# Patient Record
Sex: Female | Born: 1980 | Race: White | Hispanic: No | Marital: Married | Smoking: Never smoker
Health system: Southern US, Community
[De-identification: ages and names within clinical notes are randomized; demographics above are authoritative.]

## PROBLEM LIST (undated history)

## (undated) DIAGNOSIS — E039 Hypothyroidism, unspecified: Secondary | ICD-10-CM

## (undated) DIAGNOSIS — T7840XA Allergy, unspecified, initial encounter: Secondary | ICD-10-CM

## (undated) HISTORY — DX: Hypothyroidism, unspecified: E03.9

## (undated) HISTORY — PX: MOUTH SURGERY: SHX715

## (undated) HISTORY — DX: Allergy, unspecified, initial encounter: T78.40XA

---

## 2021-03-02 ENCOUNTER — Ambulatory Visit (INDEPENDENT_AMBULATORY_CARE_PROVIDER_SITE_OTHER): Payer: 59 | Admitting: Certified Nurse Midwife

## 2021-03-02 ENCOUNTER — Encounter: Payer: Self-pay | Admitting: Certified Nurse Midwife

## 2021-03-02 ENCOUNTER — Other Ambulatory Visit: Payer: Self-pay

## 2021-03-02 ENCOUNTER — Other Ambulatory Visit (HOSPITAL_COMMUNITY)
Admission: RE | Admit: 2021-03-02 | Discharge: 2021-03-02 | Disposition: A | Discharge status: Home / Self Care | Payer: 59 | Source: Ambulatory Visit | Attending: Certified Nurse Midwife | Admitting: Certified Nurse Midwife

## 2021-03-02 VITALS — BP 127/80 | HR 87 | Resp 16 | Ht 65.0 in | Wt 119.4 lb

## 2021-03-02 DIAGNOSIS — Z124 Encounter for screening for malignant neoplasm of cervix: Secondary | ICD-10-CM

## 2021-03-02 DIAGNOSIS — Z3041 Encounter for surveillance of contraceptive pills: Secondary | ICD-10-CM

## 2021-03-02 DIAGNOSIS — Z01419 Encounter for gynecological examination (general) (routine) without abnormal findings: Secondary | ICD-10-CM | POA: Diagnosis not present

## 2021-03-02 DIAGNOSIS — Z1231 Encounter for screening mammogram for malignant neoplasm of breast: Secondary | ICD-10-CM | POA: Diagnosis not present

## 2021-03-02 MED ORDER — SLYND 4 MG PO TABS: 1.0000 | ORAL_TABLET | Freq: Every day | ORAL | 4 refills | Status: DC

## 2021-03-02 NOTE — Patient Instructions (Addendum)
Drospirenone tablets (contraception) What is this medicine? DROSPIRENONE (dro SPY re nown) is an oral contraceptive (birth control pill). The product contains a female hormone known as a progestin. It is used to prevent pregnancy. This medicine may be used for other purposes; ask your health care provider or pharmacist if you have questions. COMMON BRAND NAME(S): Slynd What should I tell my health care provider before I take this medicine? They need to know if you have any of these conditions:  abnormal vaginal bleeding  adrenal gland disease  blood vessel disease or blood clots  breast, cervical, endometrial, ovarian, liver, or uterine cancer  diabetes  heart disease or recent heart attack  high potassium level  kidney disease  liver disease  mental depression  migraine headaches  stroke  an unusual or allergic reaction to drospirenone, progestins, or other medicines, foods, dyes, or preservatives  pregnant or trying to get pregnant  breast-feeding How should I use this medicine? Take this medicine by mouth. To reduce nausea, this medicine may be taken with food. Follow the directions on the prescription label. Take this medicine at the same time each day and in the order directed on the package. Do not take your medicine more often than directed. A patient package insert for the product will be given with each prescription and refill. Read this sheet carefully each time. The sheet may change frequently. Talk to your pediatrician regarding the use of this medicine in children. Special care may be needed. This medicine has been used in female children who have started having menstrual periods. Overdosage: If you think you have taken too much of this medicine contact a poison control center or emergency room at once. NOTE: This medicine is only for you. Do not share this medicine with others. What if I miss a dose? If you miss a dose, take it as soon as you can and refer to  the patient information sheet you received with your medicine for direction. If you miss more than one pill, this medicine may not be as effective and you may need to use another form of birth control. What may interact with this medicine? Do not take this medicine with any of the following medications:  atazanavir; cobicistat  bosentan  fosamprenavir This medicine may also interact with the following medications:  aprepitant  barbiturates like phenobarbital, primidone  carbamazepine  certain antibiotics like clarithromycin, rifampin, rifabutin, rifapentine  certain antivirals for HIV or hepatitis  certain diuretics like amiloride, spironolactone, triamterene  certain medicines for fungal infections like griseofulvin, ketoconazole, itraconazole, voriconazole  certain medicines for blood pressure, heart disease  cyclosporine  felbamate  heparin  medicines for diabetes  modafinil  NSAIDs, medicines for pain and inflammation, like ibuprofen or naproxen  oxcarbazepine  phenytoin  potassium supplements  rufinamide  St. John's wort  topiramate This list may not describe all possible interactions. Give your health care provider a list of all the medicines, herbs, non-prescription drugs, or dietary supplements you use. Also tell them if you smoke, drink alcohol, or use illegal drugs. Some items may interact with your medicine. What should I watch for while using this medicine? Visit your doctor or health care professional for regular checks on your progress. You will need a regular breast and pelvic exam and Pap smear while on this medicine. You may need blood work done while you are taking this medicine. If you have any reason to think you are pregnant, stop taking this medicine right away and contact your doctor  or health care professional. This medicine does not protect you against HIV infection (AIDS) or any other sexually transmitted diseases. If you are going to  have elective surgery, you may need to stop taking this medicine before the surgery. Consult your health care professional for advice. What side effects may I notice from receiving this medicine? Side effects that you should report to your doctor or health care professional as soon as possible:  allergic reactions like skin rash, itching or hives, swelling of the face, lips, or tongue  breast tissue changes or discharge  depressed mood  severe pain, swelling, or tenderness in the abdomen  signs and symptoms of a blood clot such as chest pain; shortness of breath; pain, swelling, or warmth in the leg  signs and symptoms of increased potassium like muscle weakness; chest pain; or fast, irregular heartbeat  signs and symptoms of liver injury like dark yellow or brown urine; general ill feeling or flu-like symptoms; light-colored stools; loss of appetite; nausea; right upper belly pain; unusually weak or tired; yellowing of the eyes or skin  signs and symptoms of a stroke like changes in vision; confusion; trouble speaking or understanding; severe headaches; sudden numbness or weakness of the face, arm or leg; trouble walking; dizziness; loss of balance or coordination  unusual vaginal bleeding  unusually weak or tired Side effects that usually do not require medical attention (report these to your doctor or health care professional if they continue or are bothersome):  acne  breast tenderness  headache  menstrual cramps  nausea  weight gain This list may not describe all possible side effects. Call your doctor for medical advice about side effects. You may report side effects to FDA at 1-800-FDA-1088. Where should I keep my medicine? Keep out of the reach of children. Store at room temperature between 20 and 25 degrees C (68 and 77 degrees F). Throw away any unused medicine after the expiration date. NOTE: This sheet is a summary. It may not cover all possible information. If you  have questions about this medicine, talk to your doctor, pharmacist, or health care provider.  2021 Elsevier/Gold Standard (2020-02-22 12:29:25)   Preventive Care 32-13 Years Old, Female Preventive care refers to lifestyle choices and visits with your health care provider that can promote health and wellness. This includes:  A yearly physical exam. This is also called an annual wellness visit.  Regular dental and eye exams.  Immunizations.  Screening for certain conditions.  Healthy lifestyle choices, such as: ? Eating a healthy diet. ? Getting regular exercise. ? Not using drugs or products that contain nicotine and tobacco. ? Limiting alcohol use. What can I expect for my preventive care visit? Physical exam Your health care provider will check your:  Height and weight. These may be used to calculate your BMI (body mass index). BMI is a measurement that tells if you are at a healthy weight.  Heart rate and blood pressure.  Body temperature.  Skin for abnormal spots. Counseling Your health care provider may ask you questions about your:  Past medical problems.  Family's medical history.  Alcohol, tobacco, and drug use.  Emotional well-being.  Home life and relationship well-being.  Sexual activity.  Diet, exercise, and sleep habits.  Work and work Statistician.  Access to firearms.  Method of birth control.  Menstrual cycle.  Pregnancy history. What immunizations do I need? Vaccines are usually given at various ages, according to a schedule. Your health care provider will recommend vaccines  for you based on your age, medical history, and lifestyle or other factors, such as travel or where you work.   What tests do I need? Blood tests  Lipid and cholesterol levels. These may be checked every 5 years, or more often if you are over 51 years old.  Hepatitis C test.  Hepatitis B test. Screening  Lung cancer screening. You may have this screening every  year starting at age 56 if you have a 30-pack-year history of smoking and currently smoke or have quit within the past 15 years.  Colorectal cancer screening. ? All adults should have this screening starting at age 23 and continuing until age 18. ? Your health care provider may recommend screening at age 46 if you are at increased risk. ? You will have tests every 1-10 years, depending on your results and the type of screening test.  Diabetes screening. ? This is done by checking your blood sugar (glucose) after you have not eaten for a while (fasting). ? You may have this done every 1-3 years.  Mammogram. ? This may be done every 1-2 years. ? Talk with your health care provider about when you should start having regular mammograms. This may depend on whether you have a family history of breast cancer.  BRCA-related cancer screening. This may be done if you have a family history of breast, ovarian, tubal, or peritoneal cancers.  Pelvic exam and Pap test. ? This may be done every 3 years starting at age 63. ? Starting at age 90, this may be done every 5 years if you have a Pap test in combination with an HPV test. Other tests  STD (sexually transmitted disease) testing, if you are at risk.  Bone density scan. This is done to screen for osteoporosis. You may have this scan if you are at high risk for osteoporosis. Talk with your health care provider about your test results, treatment options, and if necessary, the need for more tests. Follow these instructions at home: Eating and drinking  Eat a diet that includes fresh fruits and vegetables, whole grains, lean protein, and low-fat dairy products.  Take vitamin and mineral supplements as recommended by your health care provider.  Do not drink alcohol if: ? Your health care provider tells you not to drink. ? You are pregnant, may be pregnant, or are planning to become pregnant.  If you drink alcohol: ? Limit how much you have to  0-1 drink a day. ? Be aware of how much alcohol is in your drink. In the [place] one drink equals one 12 oz bottle of beer (355 mL), one 5 oz glass of wine (148 mL), or one 1 oz glass of hard liquor (44 mL).   Lifestyle  Take daily care of your teeth and gums. Brush your teeth every morning and night with fluoride toothpaste. Floss one time each day.  Stay active. Exercise for at least 30 minutes 5 or more days each week.  Do not use any products that contain nicotine or tobacco, such as cigarettes, e-cigarettes, and chewing tobacco. If you need help quitting, ask your health care provider.  Do not use drugs.  If you are sexually active, practice safe sex. Use a condom or other form of protection to prevent STIs (sexually transmitted infections).  If you do not wish to become pregnant, use a form of birth control. If you plan to become pregnant, see your health care provider for a prepregnancy visit.  If told by your  health care provider, take low-dose aspirin daily starting at age 25.  Find healthy ways to cope with stress, such as: ? Meditation, yoga, or listening to music. ? Journaling. ? Talking to a trusted person. ? Spending time with friends and family. Safety  Always wear your seat belt while driving or riding in a vehicle.  Do not drive: ? If you have been drinking alcohol. Do not ride with someone who has been drinking. ? When you are tired or distracted. ? While texting.  Wear a helmet and other protective equipment during sports activities.  If you have firearms in your house, make sure you follow all gun safety procedures. What's next?  Visit your health care provider once a year for an annual wellness visit.  Ask your health care provider how often you should have your eyes and teeth checked.  Stay up to date on all vaccines. This information is not intended to replace advice given to you by your health care provider. Make sure you discuss any questions you have  with your health care provider. Document Revised: 07/11/2020 Document Reviewed: 06/18/2018 Elsevier Patient Education  2021 [employer].

## 2021-03-02 NOTE — Progress Notes (Signed)
ANNUAL PREVENTATIVE CARE GYN  ENCOUNTER NOTE  Subjective:       Elaine Cross is a 40 y.o. [phone] female here for a routine annual gynecologic exam.  Current complaints: 1. Needs Pap smear and screening mammogram 2. Requests Slynd refill  Denies difficulty breathing or respiratory distress, chest pain, abdominal pain, excessive vaginal bleeding, dysuria, and leg pain or swelling.    Gynecologic History  No LMP recorded (exact date). (Menstrual status: Oral contraceptives).  Contraception: oral progesterone-only contraceptive, Slynd  Last Pap: 2019. Results were: normal  Last mammogram: due  Obstetric History OB History  Gravida Para Term Preterm AB Living  3       1 2   SAB IAB Ectopic Multiple Live Births  1            # Outcome Date GA Lbr Len/2nd Weight Sex Delivery Anes PTL Lv  3 Gravida           2 Gravida           1 SAB             History reviewed. No pertinent past medical history.  History reviewed. No pertinent surgical history.  Current Outpatient Medications on File Prior to Visit  Medication Sig Dispense Refill  . fexofenadine (ALLEGRA) 180 MG tablet Take 180 mg by mouth daily.    . Prenatal Vit-Fe Fumarate-FA (PRENATAL MULTIVITAMIN) TABS tablet Take 1 tablet by mouth daily at 12 noon.     No current facility-administered medications on file prior to visit.    Allergies  Allergen Reactions  . Ibuprofen Hives  . Prednisone Other (See Comments)    Severe low blood pressure, night sweats    Social History   Socioeconomic History  . Marital status: Married    Spouse name: Not on file  . Number of children: Not on file  . Years of education: Not on file  . Highest education level: Not on file  Occupational History  . Not on file  Tobacco Use  . Smoking status: Never Smoker  . Smokeless tobacco: Never Used  Vaping Use  . Vaping Use: Never used  Substance and Sexual Activity  . Alcohol use: Never  . Drug use: Never  . Sexual activity: Yes     Partners: Male    Birth control/protection: Pill  Other Topics Concern  . Not on file  Social History Narrative  . Not on file   Social Determinants of Health   Financial Resource Strain: Not on file  Food Insecurity: Not on file  Transportation Needs: Not on file  Physical Activity: Not on file  Stress: Not on file  Social Connections: Not on file  Intimate Partner Violence: Not on file    Family History  Problem Relation Age of Onset  . Hypertension Father   . Dementia Maternal Grandmother   . Heart disease Maternal Grandmother   . Heart attack Maternal Grandfather   . Gastric cancer Paternal Grandmother     The following portions of the patient's history were reviewed and updated as appropriate: allergies, current medications, past family history, past medical history, past social history, past surgical history and problem list.  Review of Systems  ROS negative except as noted above. Information obtained from patient.    Objective:   BP 127/80   Pulse 87   Resp 16   Ht 5\' 5"  (1.651 m)   Wt 119 lb 6.4 oz (54.2 kg)   LMP  (Exact Date)   BMI  19.87 kg/m    CONSTITUTIONAL: Well-developed, well-nourished female in no acute distress.   PSYCHIATRIC: Normal mood and affect. Normal behavior. Normal judgment and thought content.  NEUROLGIC: Alert and oriented to person, place, and time. Normal muscle tone coordination. No cranial nerve deficit noted.  HENT:  Normocephalic, atraumatic.  EYES: Conjunctivae and EOM are normal.   NECK: Normal range of motion, supple, no masses.  Normal thyroid.   SKIN: Skin is warm and dry. No rash noted. Not diaphoretic. No erythema. No pallor.  CARDIOVASCULAR: Normal heart rate noted, regular rhythm, no murmur.  RESPIRATORY: Clear to auscultation bilaterally. Effort and breath sounds normal, no problems with respiration noted.  BREASTS: Symmetric in size. No masses, skin changes, nipple drainage, or lymphadenopathy.  ABDOMEN:  Soft, normal bowel sounds, no distention noted.  No tenderness, rebound or guarding.   PELVIC:  External Genitalia: Normal  Vagina: Normal  Cervix: Normal, Pap collected  Uterus: Normal  Adnexa: Normal  MUSCULOSKELETAL: Normal range of motion. No tenderness.  No cyanosis, clubbing, or edema.  2+ distal pulses.  LYMPHATIC: No Axillary, Supraclavicular, or Inguinal Adenopathy.  Depression screen PHQ 2/9 03/02/2021  Down, Depressed, Hopeless 0  PHQ - 2 Score 0    Assessment:   Annual gynecologic examination 40 y.o.   Contraception: oral progesterone-only contraceptive, Slynd   Normal BMI   Problem List Items Addressed This Visit   None   Visit Diagnoses    Well woman exam    -  Primary   Relevant Orders   MM 3D SCREEN BREAST BILATERAL   Cytology - PAP   Surveillance for birth control, oral contraceptives       Screening for cervical cancer       Relevant Orders   Cytology - PAP   Screening mammogram for breast cancer       Relevant Orders   MM 3D SCREEN BREAST BILATERAL      Plan:   Pap: Pap Co Test   Mammogram: Ordered  Labs: Plans to establish with PCP, list given  Routine preventative health maintenance measures emphasized: Exercise/Diet/Weight control, Tobacco Warnings, Alcohol/Substance use risks and Stress Management; see AVS  Reviewed red flag symptoms and when to call  Return to Clinic - 1 Year for [facility] or sooner if needed   Serafina Royals, CNM  [facility], [facility] 03/02/21 2:47 PM

## 2021-03-07 LAB — CYTOLOGY - PAP
Comment: NEGATIVE
Diagnosis: NEGATIVE
High risk HPV: NEGATIVE

## 2021-05-30 ENCOUNTER — Telehealth: Payer: Self-pay | Admitting: Certified Nurse Midwife

## 2021-05-30 NOTE — Telephone Encounter (Signed)
Patient was last seen by Serafina Royals.  Marcelino Duster had ordered her mammogram.  When pt called to schedule they told her that a new order would need to be sent because Marcelino Duster is no longer with our practice.  Please let patient know when the order has been sent so that she can call and schedule her appt

## 2021-05-31 ENCOUNTER — Other Ambulatory Visit: Payer: Self-pay

## 2021-05-31 DIAGNOSIS — Z1231 Encounter for screening mammogram for malignant neoplasm of breast: Secondary | ICD-10-CM

## 2021-05-31 NOTE — Progress Notes (Signed)
Patient had an order placed for mamm last May, went to appointment and was unable to have mammogram done because order had been canceled on 05/30/2021 due to [facility] no longer being with our office. Patient needs mamm ordered to be able to get mamm as soon as possible.

## 2021-06-18 ENCOUNTER — Other Ambulatory Visit: Payer: Self-pay

## 2021-06-18 ENCOUNTER — Ambulatory Visit
Admission: RE | Admit: 2021-06-18 | Discharge: 2021-06-18 | Disposition: A | Discharge status: Home / Self Care | Payer: Managed Care, Other (non HMO) | Source: Ambulatory Visit | Attending: Certified Nurse Midwife | Admitting: Certified Nurse Midwife

## 2021-06-18 DIAGNOSIS — Z1231 Encounter for screening mammogram for malignant neoplasm of breast: Secondary | ICD-10-CM | POA: Insufficient documentation

## 2021-07-18 ENCOUNTER — Ambulatory Visit (INDEPENDENT_AMBULATORY_CARE_PROVIDER_SITE_OTHER): Payer: Managed Care, Other (non HMO) | Admitting: Nurse Practitioner

## 2021-07-18 ENCOUNTER — Other Ambulatory Visit: Payer: Self-pay

## 2021-07-18 ENCOUNTER — Encounter: Payer: Self-pay | Admitting: Nurse Practitioner

## 2021-07-18 VITALS — BP 124/85 | HR 82 | Temp 99.2°F | Ht 65.0 in | Wt 122.6 lb

## 2021-07-18 DIAGNOSIS — Z7689 Persons encountering health services in other specified circumstances: Secondary | ICD-10-CM

## 2021-07-18 DIAGNOSIS — Z789 Other specified health status: Secondary | ICD-10-CM | POA: Diagnosis not present

## 2021-07-18 NOTE — Assessment & Plan Note (Signed)
Continue collaboration with GYN -- has annual pap testing with them.

## 2021-07-18 NOTE — Patient Instructions (Signed)

## 2021-07-18 NOTE — Progress Notes (Signed)
New Patient Office Visit  Subjective:  Patient ID: Elaine Cross, female    DOB: 1981/02/12  Age: 40 y.o. MRN: [id number]  CC:  Chief Complaint  Patient presents with   Establish Care    Patient is here to establish care. Patient denies having any concerns at today's visit. Patient states she is new to the area and wanted to establish care and have labs and routine work up.     HPI Elaine Cross presents for new patient visit to establish care.  Introduced to Publishing rights manager role and practice setting.  All questions answered.  Discussed provider/patient relationship and expectations.  Moved here one year ago from [place] of [place].  Was often followed by gynecology -- [facility].  Followed by [facility] locally.  Is on BCP - Slynd.    No current chronic health issues -- watches her BP due to her father having elevated BP.  Does endorse white coat syndrome.  History reviewed. No pertinent past medical history.  Past Surgical History:  Procedure Laterality Date   MOUTH SURGERY      Family History  Problem Relation Age of Onset   Thyroid disease Mother    Hypertension Father    Depression Brother    Healthy Brother    Breast cancer Paternal Aunt 30   Dementia Maternal Grandmother    Heart disease Maternal Grandmother    Heart attack Maternal Grandfather    Gastric cancer Paternal Grandmother     Social History   Socioeconomic History   Marital status: Married    Spouse name: Not on file   Number of children: Not on file   Years of education: Not on file   Highest education level: Not on file  Occupational History   Not on file  Tobacco Use   Smoking status: Never   Smokeless tobacco: Never  Vaping Use   Vaping Use: Never used  Substance and Sexual Activity   Alcohol use: Never   Drug use: Never   Sexual activity: Yes    Partners: Male    Birth control/protection: Pill  Other Topics Concern   Not on file  Social History Narrative   Not on  file   Social Determinants of Health   Financial Resource Strain: Low Risk    Difficulty of Paying Living Expenses: Not hard at all  Food Insecurity: No Food Insecurity   Worried About Programme researcher, broadcasting/film/video in the Last Year: Never true   Ran Out of Food in the Last Year: Never true  Transportation Needs: No Transportation Needs   Lack of Transportation (Medical): No   Lack of Transportation (Non-Medical): No  Physical Activity: Insufficiently Active   Days of Exercise per Week: 4 days   Minutes of Exercise per Session: 30 min  Stress: No Stress Concern Present   Feeling of Stress : Only a little  Social Connections: Moderately Integrated   Frequency of Communication with Friends and Family: More than three times a week   Frequency of Social Gatherings with Friends and Family: More than three times a week   Attends Religious Services: More than 4 times per year   Active Member of [employer] or Organizations: No   Attends Banker Meetings: Never   Marital Status: Married  Catering manager Violence: Not At Risk   Fear of Current or Ex-Partner: No   Emotionally Abused: No   Physically Abused: No   Sexually Abused: No    ROS Review of  Systems  Constitutional:  Negative for activity change, appetite change, diaphoresis, fatigue and fever.  Respiratory:  Negative for cough, chest tightness and shortness of breath.   Cardiovascular:  Negative for chest pain, palpitations and leg swelling.  Gastrointestinal: Negative.   Neurological: Negative.   Psychiatric/Behavioral: Negative.     Objective:   Today's Vitals: BP 124/85   Pulse 82   Temp 99.2 F (37.3 C) (Oral)   Ht 5\' 5"  (1.651 m)   Wt 122 lb 9.6 oz (55.6 kg)   SpO2 100%   BMI 20.40 kg/m   Physical Exam Vitals and nursing note reviewed.  Constitutional:      General: She is awake. She is not in acute distress.    Appearance: She is well-developed and well-groomed. She is not ill-appearing or toxic-appearing.   HENT:     Head: Normocephalic.     Right Ear: Hearing normal.     Left Ear: Hearing normal.  Eyes:     General: Lids are normal.        Right eye: No discharge.        Left eye: No discharge.     Conjunctiva/sclera: Conjunctivae normal.     Pupils: Pupils are equal, round, and reactive to light.  Neck:     Thyroid: No thyromegaly.     Vascular: No carotid bruit.  Cardiovascular:     Rate and Rhythm: Normal rate and regular rhythm.     Heart sounds: Normal heart sounds. No murmur heard.   No gallop.  Pulmonary:     Effort: Pulmonary effort is normal. No accessory muscle usage or respiratory distress.     Breath sounds: Normal breath sounds.  Abdominal:     General: Bowel sounds are normal.     Palpations: Abdomen is soft.  Musculoskeletal:     Cervical back: Normal range of motion and neck supple.     Right lower leg: No edema.     Left lower leg: No edema.  Lymphadenopathy:     Cervical: No cervical adenopathy.  Skin:    General: Skin is warm and dry.  Neurological:     Mental Status: She is alert and oriented to person, place, and time.  Psychiatric:        Attention and Perception: Attention normal.        Mood and Affect: Mood normal.        Speech: Speech normal.        Behavior: Behavior normal. Behavior is cooperative.        Thought Content: Thought content normal.    Assessment & Plan:   Problem List Items Addressed This Visit       Other   Uses birth control    Continue collaboration with GYN -- has annual pap testing with them.        Other Visit Diagnoses     Encounter to establish care    -  Primary   Overall healthy with no acute issues -- will plan on physical in 4 weeks.       Outpatient Encounter Medications as of 07/18/2021  Medication Sig   Drospirenone (SLYND) 4 MG TABS Take 1 tablet by mouth daily.   fexofenadine (ALLEGRA) 180 MG tablet Take 180 mg by mouth daily.   Prenatal Vit-Fe Fumarate-FA (PRENATAL MULTIVITAMIN) TABS tablet  Take 1 tablet by mouth daily at 12 noon.   No facility-administered encounter medications on file as of 07/18/2021.    Follow-up: Return in about 4  weeks (around 08/15/2021) for Annual physical.   Marjie Skiff, NP

## 2021-08-10 ENCOUNTER — Encounter: Payer: Managed Care, Other (non HMO) | Admitting: Nurse Practitioner

## 2021-09-17 ENCOUNTER — Other Ambulatory Visit: Payer: Self-pay

## 2021-09-17 ENCOUNTER — Ambulatory Visit (INDEPENDENT_AMBULATORY_CARE_PROVIDER_SITE_OTHER): Payer: Managed Care, Other (non HMO) | Admitting: Nurse Practitioner

## 2021-09-17 ENCOUNTER — Encounter: Payer: Self-pay | Admitting: Nurse Practitioner

## 2021-09-17 VITALS — BP 96/68 | HR 87 | Wt 122.8 lb

## 2021-09-17 DIAGNOSIS — E559 Vitamin D deficiency, unspecified: Secondary | ICD-10-CM | POA: Diagnosis not present

## 2021-09-17 DIAGNOSIS — Z8342 Family history of familial hypercholesterolemia: Secondary | ICD-10-CM

## 2021-09-17 DIAGNOSIS — Z23 Encounter for immunization: Secondary | ICD-10-CM | POA: Diagnosis not present

## 2021-09-17 DIAGNOSIS — J309 Allergic rhinitis, unspecified: Secondary | ICD-10-CM | POA: Insufficient documentation

## 2021-09-17 DIAGNOSIS — Z1159 Encounter for screening for other viral diseases: Secondary | ICD-10-CM

## 2021-09-17 DIAGNOSIS — Z136 Encounter for screening for cardiovascular disorders: Secondary | ICD-10-CM

## 2021-09-17 DIAGNOSIS — Z0000 Encounter for general adult medical examination without abnormal findings: Secondary | ICD-10-CM | POA: Diagnosis not present

## 2021-09-17 DIAGNOSIS — Z13220 Encounter for screening for lipoid disorders: Secondary | ICD-10-CM | POA: Diagnosis not present

## 2021-09-17 DIAGNOSIS — Z114 Encounter for screening for human immunodeficiency virus [HIV]: Secondary | ICD-10-CM

## 2021-09-17 NOTE — Progress Notes (Signed)
BP 96/68   Pulse 87   Wt 122 lb 12.8 oz (55.7 kg)   SpO2 100%   BMI 20.43 kg/m    Subjective:    Patient ID: Elaine Cross, female    DOB: November 02, 1980, 40 y.o.   MRN: [id number]  HPI: Elaine Cross is a 40 y.o. female presenting on 09/17/2021 for comprehensive medical examination. Current medical complaints include:none  She currently lives with: husband and children Menopausal Symptoms: no  Depression Screen done today and results listed below:  Depression screen [facility] 2/9 09/17/2021 07/18/2021 03/02/2021  Decreased Interest 0 0 -  Down, Depressed, Hopeless 0 0 0  PHQ - 2 Score 0 0 0  Altered sleeping 0 - -  Tired, decreased energy 0 - -  Change in appetite 0 - -  Feeling bad or failure about yourself  0 - -  Trouble concentrating 0 - -  Moving slowly or fidgety/restless 0 - -  Suicidal thoughts 0 - -  PHQ-9 Score 0 - -  Difficult doing work/chores Not difficult at all - -    The patient does not have a history of falls. I did not complete a risk assessment for falls. A plan of care for falls was not documented.   Past Medical History:  History reviewed. No pertinent past medical history.  Surgical History:  Past Surgical History:  Procedure Laterality Date   MOUTH SURGERY      Medications:  Current Outpatient Medications on File Prior to Visit  Medication Sig   Drospirenone (SLYND) 4 MG TABS Take 1 tablet by mouth daily.   fexofenadine (ALLEGRA) 180 MG tablet Take 180 mg by mouth daily.   Prenatal Vit-Fe Fumarate-FA (PRENATAL MULTIVITAMIN) TABS tablet Take 1 tablet by mouth daily at 12 noon.   No current facility-administered medications on file prior to visit.    Allergies:  Allergies  Allergen Reactions   Ibuprofen Hives   Other    Prednisone Other (See Comments)    Severe low blood pressure, night sweats    Social History:  Social History   Socioeconomic History   Marital status: Married    Spouse name: Not on file   Number of  children: Not on file   Years of education: Not on file   Highest education level: Not on file  Occupational History   Not on file  Tobacco Use   Smoking status: Never   Smokeless tobacco: Never  Vaping Use   Vaping Use: Never used  Substance and Sexual Activity   Alcohol use: Never   Drug use: Never   Sexual activity: Yes    Partners: Male    Birth control/protection: Pill  Other Topics Concern   Not on file  Social History Narrative   Not on file   Social Determinants of Health   Financial Resource Strain: Low Risk    Difficulty of Paying Living Expenses: Not hard at all  Food Insecurity: No Food Insecurity   Worried About Programme researcher, broadcasting/film/video in the Last Year: Never true   Ran Out of Food in the Last Year: Never true  Transportation Needs: No Transportation Needs   Lack of Transportation (Medical): No   Lack of Transportation (Non-Medical): No  Physical Activity: Insufficiently Active   Days of Exercise per Week: 4 days   Minutes of Exercise per Session: 30 min  Stress: No Stress Concern Present   Feeling of Stress : Only a little  Social Connections: Moderately Integrated  Frequency of Communication with Friends and Family: More than three times a week   Frequency of Social Gatherings with Friends and Family: More than three times a week   Attends Religious Services: More than 4 times per year   Active Member of [employer] or Organizations: No   Attends Engineer, structural: Never   Marital Status: Married  Catering manager Violence: Not At Risk   Fear of Current or Ex-Partner: No   Emotionally Abused: No   Physically Abused: No   Sexually Abused: No   Social History   Tobacco Use  Smoking Status Never  Smokeless Tobacco Never   Social History   Substance and Sexual Activity  Alcohol Use Never    Family History:  Family History  Problem Relation Age of Onset   Thyroid disease Mother    Hypertension Father    Depression Brother    Healthy  Brother    Breast cancer Paternal Aunt 30   Dementia Maternal Grandmother    Heart disease Maternal Grandmother    Heart attack Maternal Grandfather    Gastric cancer Paternal Grandmother     Past medical history, surgical history, medications, allergies, family history and social history reviewed with patient today and changes made to appropriate areas of the chart.   ROS All other ROS negative except what is listed above and in the HPI.      Objective:    BP 96/68   Pulse 87   Wt 122 lb 12.8 oz (55.7 kg)   SpO2 100%   BMI 20.43 kg/m   Wt Readings from Last 3 Encounters:  09/17/21 122 lb 12.8 oz (55.7 kg)  07/18/21 122 lb 9.6 oz (55.6 kg)  03/02/21 119 lb 6.4 oz (54.2 kg)    Physical Exam Vitals and nursing note reviewed.  Constitutional:      General: She is awake. She is not in acute distress.    Appearance: She is well-developed and well-groomed. She is not ill-appearing or toxic-appearing.  HENT:     Head: Normocephalic and atraumatic.     Right Ear: Hearing, tympanic membrane, ear canal and external ear normal. No drainage.     Left Ear: Hearing, tympanic membrane, ear canal and external ear normal. No drainage.     Nose: Nose normal.     Right Sinus: No maxillary sinus tenderness or frontal sinus tenderness.     Left Sinus: No maxillary sinus tenderness or frontal sinus tenderness.     Mouth/Throat:     Mouth: Mucous membranes are moist.     Pharynx: Oropharynx is clear. Uvula midline. No pharyngeal swelling, oropharyngeal exudate or posterior oropharyngeal erythema.  Eyes:     General: Lids are normal.        Right eye: No discharge.        Left eye: No discharge.     Extraocular Movements: Extraocular movements intact.     Conjunctiva/sclera: Conjunctivae normal.     Pupils: Pupils are equal, round, and reactive to light.     Visual Fields: Right eye visual fields normal and left eye visual fields normal.  Neck:     Thyroid: No thyromegaly.     Vascular:  No carotid bruit.     Trachea: Trachea normal.  Cardiovascular:     Rate and Rhythm: Normal rate and regular rhythm.     Heart sounds: Normal heart sounds. No murmur heard.   No gallop.  Pulmonary:     Effort: Pulmonary effort is normal. No  accessory muscle usage or respiratory distress.     Breath sounds: Normal breath sounds.  Abdominal:     General: Bowel sounds are normal.     Palpations: Abdomen is soft. There is no hepatomegaly or splenomegaly.     Tenderness: There is no abdominal tenderness.  Musculoskeletal:        General: Normal range of motion.     Cervical back: Normal range of motion and neck supple.     Right lower leg: No edema.     Left lower leg: No edema.  Lymphadenopathy:     Head:     Right side of head: No submental, submandibular, tonsillar, preauricular or posterior auricular adenopathy.     Left side of head: No submental, submandibular, tonsillar, preauricular or posterior auricular adenopathy.     Cervical: No cervical adenopathy.  Skin:    General: Skin is warm and dry.     Capillary Refill: Capillary refill takes less than 2 seconds.     Findings: No rash.  Neurological:     Mental Status: She is alert and oriented to person, place, and time.     Gait: Gait is intact.     Deep Tendon Reflexes: Reflexes are normal and symmetric.     Reflex Scores:      Brachioradialis reflexes are 2+ on the right side and 2+ on the left side.      Patellar reflexes are 2+ on the right side and 2+ on the left side. Psychiatric:        Attention and Perception: Attention normal.        Mood and Affect: Mood normal.        Speech: Speech normal.        Behavior: Behavior normal. Behavior is cooperative.        Thought Content: Thought content normal.        Judgment: Judgment normal.    Results for orders placed or performed in visit on 03/02/21  Cytology - PAP  Result Value Ref Range   High risk HPV Negative    Adequacy      Satisfactory for evaluation;  transformation zone component PRESENT.   Diagnosis      - Negative for intraepithelial lesion or malignancy (NILM)   Comment Normal Reference Range HPV - Negative       Assessment & Plan:   Problem List Items Addressed This Visit       Other   Family history of high cholesterol    Check lipid panel today and yearly -- diet focus.      Relevant Orders   Lipid Panel w/o Chol/HDL Ratio   Other Visit Diagnoses     Vitamin D deficiency    -  Primary   History of low levels reported, check on labs today and initiate supplement as needed.   Relevant Orders   VITAMIN D 25 Hydroxy (Vit-D Deficiency, Fractures)   Encounter for lipid screening for cardiovascular disease       Check lipid panel today   Relevant Orders   Lipid Panel w/o Chol/HDL Ratio   Need for hepatitis C screening test       Hep C screening on labs today, discussed with patient.   Relevant Orders   Hepatitis C antibody   Encounter for screening for HIV       HIV screening on labs today, discussed with patient.   Relevant Orders   HIV Antibody (routine testing w rflx)   Need for  Tdap vaccination       Tdap in office today, discussed with patient.   Relevant Orders   Tdap vaccine greater than or equal to 7yo IM   Flu vaccine need       Flu vaccine in office today.   Relevant Orders   Flu Vaccine QUAD 6+ mos PF IM (Fluarix Quad PF)   Encounter for annual physical exam       Annual physical today in office, will obtain annual labs.   Relevant Orders   CBC with Differential/Platelet   Comprehensive metabolic panel   TSH        Follow up plan: Return in about 1 year (around 09/17/2022) for Annual physical.   LABORATORY TESTING:  - Pap smear: up to date  IMMUNIZATIONS:   - Tdap: Tetanus vaccination status reviewed: last tetanus booster within 10 years, Td vaccination indicated and given today. - Influenza: Administered today - Pneumovax: Not applicable - Prevnar: Not applicable - COVID: Refused -  HPV: Not applicable - Shingrix vaccine: Not applicable  SCREENING: -Mammogram: Up to date  - Colonoscopy: Not applicable  - Bone Density: Not applicable  -Hearing Test: Not applicable  -Spirometry: Not applicable   PATIENT COUNSELING:   Advised to take 1 mg of folate supplement per day if capable of pregnancy.   Sexuality: Discussed sexually transmitted diseases, partner selection, use of condoms, avoidance of unintended pregnancy  and contraceptive alternatives.   Advised to avoid cigarette smoking.  I discussed with the patient that most people either abstain from alcohol or drink within safe limits (<=14/week and <=4 drinks/occasion for males, <=7/weeks and <= 3 drinks/occasion for females) and that the risk for alcohol disorders and other health effects rises proportionally with the number of drinks per week and how often a drinker exceeds daily limits.  Discussed cessation/primary prevention of drug use and availability of treatment for abuse.   Diet: Encouraged to adjust caloric intake to maintain  or achieve ideal body weight, to reduce intake of dietary saturated fat and total fat, to limit sodium intake by avoiding high sodium foods and not adding table salt, and to maintain adequate dietary potassium and calcium preferably from fresh fruits, vegetables, and low-fat dairy products.    Stressed the importance of regular exercise  Injury prevention: Discussed safety belts, safety helmets, smoke detector, smoking near bedding or upholstery.   Dental health: Discussed importance of regular tooth brushing, flossing, and dental visits.    NEXT PREVENTATIVE PHYSICAL DUE IN 1 YEAR. Return in about 1 year (around 09/17/2022) for Annual physical.

## 2021-09-17 NOTE — Patient Instructions (Signed)
Influenza (Flu) Vaccine (Inactivated or Recombinant): What You Need to Know 1. Why get vaccinated? Influenza vaccine can prevent influenza (flu). Flu is a contagious disease that spreads around the [place] every year, usually between October and May. Anyone can get the flu, but it is more dangerous for some people. Infants and young children, people 65 years and older, pregnant people, and people with certain health conditions or a weakened immune system are at greatest risk of flu complications. Pneumonia, bronchitis, sinus infections, and ear infections are examples of flu-related complications. If you have a medical condition, such as heart disease, cancer, or diabetes, flu can make it worse. Flu can cause fever and chills, sore throat, muscle aches, fatigue, cough, headache, and runny or stuffy nose. Some people may have vomiting and diarrhea, though this is more common in children than adults. In an average year, thousands of people in the [place] die from flu, and many more are hospitalized. Flu vaccine prevents millions of illnesses and flu-related visits to the doctor each year. 2. Influenza vaccines CDC recommends everyone 6 months and older get vaccinated every flu season. Children 6 months through 8 years of age may need 2 doses during a single flu season. Everyone else needs only 1 dose each flu season. It takes about 2 weeks for protection to develop after vaccination. There are many flu viruses, and they are always changing. Each year a new flu vaccine is made to protect against the influenza viruses believed to be likely to cause disease in the upcoming flu season. Even when the vaccine doesn't exactly match these viruses, it may still provide some protection. Influenza vaccine does not cause flu. Influenza vaccine may be given at the same time as other vaccines. 3. Talk with your health care provider Tell your vaccination provider if the person getting the vaccine: Has had  an allergic reaction after a previous dose of influenza vaccine, or has any severe, life-threatening allergies Has ever had Guillain-Barr Syndrome (also called "GBS") In some cases, your health care provider may decide to postpone influenza vaccination until a future visit. Influenza vaccine can be administered at any time during pregnancy. People who are or will be pregnant during influenza season should receive inactivated influenza vaccine. People with minor illnesses, such as a cold, may be vaccinated. People who are moderately or severely ill should usually wait until they recover before getting influenza vaccine. Your health care provider can give you more information. 4. Risks of a vaccine reaction Soreness, redness, and swelling where the shot is given, fever, muscle aches, and headache can happen after influenza vaccination. There may be a very small increased risk of Guillain-Barr Syndrome (GBS) after inactivated influenza vaccine (the flu shot). Young children who get the flu shot along with pneumococcal vaccine (PCV13) and/or DTaP vaccine at the same time might be slightly more likely to have a seizure caused by fever. Tell your health care provider if a child who is getting flu vaccine has ever had a seizure. People sometimes faint after medical procedures, including vaccination. Tell your provider if you feel dizzy or have vision changes or ringing in the ears. As with any medicine, there is a very remote chance of a vaccine causing a severe allergic reaction, other serious injury, or death. 5. What if there is a serious problem? An allergic reaction could occur after the vaccinated person leaves the clinic. If you see signs of a severe allergic reaction (hives, swelling of the face and throat, difficulty breathing,   a fast heartbeat, dizziness, or weakness), call 9-1-1 and get the person to the nearest hospital. For other signs that concern you, call your health care provider. Adverse  reactions should be reported to the Vaccine Adverse Event Reporting System (VAERS). Your health care provider will usually file this report, or you can do it yourself. Visit the VAERS website at www.vaers.hhs.gov or call 1-800-822-7967. VAERS is only for reporting reactions, and VAERS staff members do not give medical advice. 6. The National Vaccine Injury Compensation Program The National Vaccine Injury Compensation Program (VICP) is a federal program that was created to compensate people who may have been injured by certain vaccines. Claims regarding alleged injury or death due to vaccination have a time limit for filing, which may be as short as two years. Visit the VICP website at www.hrsa.gov/vaccinecompensation or call 1-800-338-2382 to learn about the program and about filing a claim. 7. How can I learn more? Ask your health care provider. Call your local or state health department. Visit the website of the Food and Drug Administration (FDA) for vaccine package inserts and additional information at www.fda.gov/vaccines-blood-biologics/vaccines. Contact the Centers for Disease Control and Prevention (CDC): Call 1-800-232-4636 (1-800-CDC-INFO) or Visit CDC's website at www.cdc.gov/flu. Vaccine Information Statement Inactivated Influenza Vaccine (05/26/2020) This information is not intended to replace advice given to you by your health care provider. Make sure you discuss any questions you have with your health care provider. Document Revised: 06/28/2021 Document Reviewed: 06/28/2021 Elsevier Patient Education  2022 Elsevier Inc.  

## 2021-09-17 NOTE — Assessment & Plan Note (Signed)
Check lipid panel today and yearly -- diet focus.  

## 2021-09-18 ENCOUNTER — Encounter: Payer: Self-pay | Admitting: Nurse Practitioner

## 2021-09-18 ENCOUNTER — Telehealth: Payer: Self-pay

## 2021-09-18 ENCOUNTER — Other Ambulatory Visit: Payer: Self-pay | Admitting: Nurse Practitioner

## 2021-09-18 DIAGNOSIS — E039 Hypothyroidism, unspecified: Secondary | ICD-10-CM

## 2021-09-18 LAB — COMPREHENSIVE METABOLIC PANEL
ALT: 24 IU/L (ref 0–32)
AST: 23 IU/L (ref 0–40)
Albumin/Globulin Ratio: 2.3 — ABNORMAL HIGH (ref 1.2–2.2)
Albumin: 4.8 g/dL (ref 3.8–4.8)
Alkaline Phosphatase: 52 IU/L (ref 44–121)
BUN/Creatinine Ratio: 15 (ref 9–23)
BUN: 13 mg/dL (ref 6–24)
Bilirubin Total: 0.4 mg/dL (ref 0.0–1.2)
CO2: 20 mmol/L (ref 20–29)
Calcium: 9.2 mg/dL (ref 8.7–10.2)
Chloride: 104 mmol/L (ref 96–106)
Creatinine, Ser: 0.86 mg/dL (ref 0.57–1.00)
Globulin, Total: 2.1 g/dL (ref 1.5–4.5)
Glucose: 80 mg/dL (ref 70–99)
Potassium: 4.2 mmol/L (ref 3.5–5.2)
Sodium: 139 mmol/L (ref 134–144)
Total Protein: 6.9 g/dL (ref 6.0–8.5)
eGFR: 87 mL/min/{1.73_m2} (ref >59)

## 2021-09-18 LAB — VITAMIN D 25 HYDROXY (VIT D DEFICIENCY, FRACTURES): Vit D, 25-Hydroxy: 40.5 ng/mL (ref 30.0–100.0)

## 2021-09-18 LAB — LIPID PANEL W/O CHOL/HDL RATIO
Cholesterol, Total: 239 mg/dL — ABNORMAL HIGH (ref 100–199)
HDL: 62 mg/dL (ref >39)
LDL Chol Calc (NIH): 165 mg/dL — ABNORMAL HIGH (ref 0–99)
Triglycerides: 71 mg/dL (ref 0–149)
VLDL Cholesterol Cal: 12 mg/dL (ref 5–40)

## 2021-09-18 LAB — CBC WITH DIFFERENTIAL/PLATELET
Basophils Absolute: 0 10*3/uL (ref 0.0–0.2)
Basos: 1 %
EOS (ABSOLUTE): 0.1 10*3/uL (ref 0.0–0.4)
Eos: 1 %
Hematocrit: 43.7 % (ref 34.0–46.6)
Hemoglobin: 15 g/dL (ref 11.1–15.9)
Immature Grans (Abs): 0 10*3/uL (ref 0.0–0.1)
Immature Granulocytes: 0 %
Lymphocytes Absolute: 1.6 10*3/uL (ref 0.7–3.1)
Lymphs: 32 %
MCH: 30.2 pg (ref 26.6–33.0)
MCHC: 34.3 g/dL (ref 31.5–35.7)
MCV: 88 fL (ref 79–97)
Monocytes Absolute: 0.4 10*3/uL (ref 0.1–0.9)
Monocytes: 7 %
Neutrophils Absolute: 2.9 10*3/uL (ref 1.4–7.0)
Neutrophils: 59 %
Platelets: 231 10*3/uL (ref 150–450)
RBC: 4.96 x10E6/uL (ref 3.77–5.28)
RDW: 12 % (ref 11.7–15.4)
WBC: 5 10*3/uL (ref 3.4–10.8)

## 2021-09-18 LAB — HEPATITIS C ANTIBODY: Hep C Virus Ab: <0.1 s/co ratio (ref 0.0–0.9)

## 2021-09-18 LAB — HIV ANTIBODY (ROUTINE TESTING W REFLEX): HIV Screen 4th Generation wRfx: NONREACTIVE

## 2021-09-18 LAB — TSH: TSH: 6.4 u[IU]/mL — ABNORMAL HIGH (ref 0.450–4.500)

## 2021-10-16 NOTE — Telephone Encounter (Signed)
error 

## 2021-10-17 ENCOUNTER — Other Ambulatory Visit: Payer: Self-pay

## 2021-10-17 ENCOUNTER — Other Ambulatory Visit: Payer: Managed Care, Other (non HMO)

## 2021-10-17 DIAGNOSIS — E039 Hypothyroidism, unspecified: Secondary | ICD-10-CM

## 2021-10-18 ENCOUNTER — Encounter: Payer: Self-pay | Admitting: Nurse Practitioner

## 2021-10-18 ENCOUNTER — Other Ambulatory Visit: Payer: Self-pay | Admitting: Nurse Practitioner

## 2021-10-18 LAB — TSH: TSH: 5.86 u[IU]/mL — ABNORMAL HIGH (ref 0.450–4.500)

## 2021-10-18 LAB — THYROID PEROXIDASE ANTIBODY: Thyroperoxidase Ab SerPl-aCnc: <9 IU/mL (ref 0–34)

## 2021-10-18 LAB — T4, FREE: Free T4: 1.22 ng/dL (ref 0.82–1.77)

## 2021-10-18 MED ORDER — LEVOTHYROXINE SODIUM 25 MCG PO TABS: 25.0000 ug | ORAL_TABLET | Freq: Every day | ORAL | 4 refills | Status: DC

## 2021-10-18 NOTE — Progress Notes (Signed)
Contacted via MyChart  == please schedule a 6 week follow-up visit with me.   Good morning Elaine Cross, your thyroid labs have returned and TSH remains elevated, although has come down a little.  Free T4 upper normal level and antibody normal (meaning no Hashimoto's).  At this time I would recommend starting a low dose of Levothyroxine, which I will send in.  I would like to see you back in 6 weeks in office for follow-up, I will have staff call you to schedule.  Any questions? Keep being awesome!!  Thank you for allowing me to participate in your care.  I appreciate you. Kindest regards, Journey Ratterman

## 2021-11-24 DIAGNOSIS — E039 Hypothyroidism, unspecified: Secondary | ICD-10-CM | POA: Insufficient documentation

## 2021-11-24 NOTE — Patient Instructions (Incomplete)

## 2021-11-27 ENCOUNTER — Ambulatory Visit: Payer: Managed Care, Other (non HMO) | Admitting: Nurse Practitioner

## 2021-11-27 DIAGNOSIS — E039 Hypothyroidism, unspecified: Secondary | ICD-10-CM

## 2021-12-03 ENCOUNTER — Other Ambulatory Visit: Payer: Self-pay

## 2021-12-03 ENCOUNTER — Ambulatory Visit (INDEPENDENT_AMBULATORY_CARE_PROVIDER_SITE_OTHER): Payer: Managed Care, Other (non HMO) | Admitting: Nurse Practitioner

## 2021-12-03 ENCOUNTER — Encounter: Payer: Self-pay | Admitting: Nurse Practitioner

## 2021-12-03 VITALS — BP 110/78 | HR 82 | Temp 98.2°F | Ht 65.0 in | Wt 121.4 lb

## 2021-12-03 DIAGNOSIS — E039 Hypothyroidism, unspecified: Secondary | ICD-10-CM | POA: Diagnosis not present

## 2021-12-03 NOTE — Patient Instructions (Signed)

## 2021-12-03 NOTE — Assessment & Plan Note (Signed)
Diagnosed 10/17/21 and started on Levothyroxine, has noticed improvement in symptoms.  At this time continue current dosing and adjust as needed based on labs.  Free T4 and TSH today.

## 2021-12-03 NOTE — Progress Notes (Signed)
BP 110/78    Pulse 82    Temp 98.2 F (36.8 C) (Oral)    Ht 5\' 5"  (1.651 m)    Wt 121 lb 6.4 oz (55.1 kg)    SpO2 100%    BMI 20.20 kg/m    Subjective:    Patient ID: Elaine Cross Elaine Cross, female    DOB: January 14, 1981, 41 y.o.   MRN: [id number]  HPI: Elaine Cross TORRIS is a 41 y.o. female  Chief Complaint  Patient presents with   Hypothyroidism    Patient is here for a follow on Hypothyroidism. Patient denies having any concerns at today's visit.    HYPOTHYROIDISM Started on Levothyroxine 25 MCG on 10/17/21 -- she does report noticing difference in various things, like less hair loss and less fatigue + less cold intolerance. Thyroid control status:stable Satisfied with current treatment? yes Medication side effects: no Medication compliance: good compliance Etiology of hypothyroidism:  Recent dose adjustment:no Fatigue: no Cold intolerance: no Heat intolerance: no Weight gain: no Weight loss: no Constipation: no Diarrhea/loose stools: no Palpitations: no Lower extremity edema: no Anxiety/depressed mood: no   Relevant past medical, surgical, family and social history reviewed and updated as indicated. Interim medical history since our last visit reviewed. Allergies and medications reviewed and updated.  Review of Systems  Constitutional:  Negative for activity change, appetite change, diaphoresis, fatigue and fever.  Respiratory:  Negative for cough, chest tightness and shortness of breath.   Cardiovascular:  Negative for chest pain, palpitations and leg swelling.  Gastrointestinal: Negative.   Neurological: Negative.   Psychiatric/Behavioral: Negative.     Per HPI unless specifically indicated above     Objective:    BP 110/78    Pulse 82    Temp 98.2 F (36.8 C) (Oral)    Ht 5\' 5"  (1.651 m)    Wt 121 lb 6.4 oz (55.1 kg)    SpO2 100%    BMI 20.20 kg/m   Wt Readings from Last 3 Encounters:  12/03/21 121 lb 6.4 oz (55.1 kg)  09/17/21 122 lb 12.8 oz (55.7 kg)   07/18/21 122 lb 9.6 oz (55.6 kg)    Physical Exam Vitals and nursing note reviewed.  Constitutional:      General: She is awake. She is not in acute distress.    Appearance: She is well-developed and well-groomed. She is not ill-appearing or toxic-appearing.  HENT:     Head: Normocephalic.     Right Ear: Hearing normal.     Left Ear: Hearing normal.  Eyes:     General: Lids are normal.        Right eye: No discharge.        Left eye: No discharge.     Conjunctiva/sclera: Conjunctivae normal.     Pupils: Pupils are equal, round, and reactive to light.  Neck:     Thyroid: No thyromegaly.     Vascular: No carotid bruit.  Cardiovascular:     Rate and Rhythm: Normal rate and regular rhythm.     Heart sounds: Normal heart sounds. No murmur heard.   No gallop.  Pulmonary:     Effort: Pulmonary effort is normal. No accessory muscle usage or respiratory distress.     Breath sounds: Normal breath sounds.  Abdominal:     General: Bowel sounds are normal.     Palpations: Abdomen is soft.  Musculoskeletal:     Cervical back: Normal range of motion and neck supple.     Right lower  leg: No edema.     Left lower leg: No edema.  Lymphadenopathy:     Cervical: No cervical adenopathy.  Skin:    General: Skin is warm and dry.  Neurological:     Mental Status: She is alert and oriented to person, place, and time.  Psychiatric:        Attention and Perception: Attention normal.        Mood and Affect: Mood normal.        Speech: Speech normal.        Behavior: Behavior normal. Behavior is cooperative.        Thought Content: Thought content normal.    Results for orders placed or performed in visit on 10/17/21  TSH  Result Value Ref Range   TSH 5.860 (H) 0.450 - 4.500 uIU/mL  Thyroid peroxidase antibody  Result Value Ref Range   Thyroperoxidase Ab SerPl-aCnc <9 0 - 34 IU/mL  T4, free  Result Value Ref Range   Free T4 1.22 0.82 - 1.77 ng/dL      Assessment & Plan:   Problem  List Items Addressed This Visit       Endocrine   Hypothyroid - Primary    Diagnosed 10/17/21 and started on Levothyroxine, has noticed improvement in symptoms.  At this time continue current dosing and adjust as needed based on labs.  Free T4 and TSH today.      Relevant Orders   T4, free   TSH     Follow up plan: Return in about 6 months (around 06/02/2022) for Hypothyroid.

## 2021-12-04 LAB — TSH: TSH: 3.6 u[IU]/mL (ref 0.450–4.500)

## 2021-12-04 LAB — T4, FREE: Free T4: 1.29 ng/dL (ref 0.82–1.77)

## 2021-12-04 NOTE — Progress Notes (Signed)
Contacted via MyChart   Good morning Elaine Cross, your labs have returned and thyroid levels now at normal levels.  Continue current Levothyroxine dosing and we will see you in 6 months:) Keep being amazing!!  Thank you for allowing me to participate in your care.  I appreciate you. Kindest regards, Navjot Loera

## 2022-01-14 ENCOUNTER — Encounter: Payer: Self-pay | Admitting: Nurse Practitioner

## 2022-01-15 ENCOUNTER — Other Ambulatory Visit: Payer: Self-pay

## 2022-01-15 ENCOUNTER — Encounter: Payer: Self-pay | Admitting: Nurse Practitioner

## 2022-01-15 ENCOUNTER — Ambulatory Visit (INDEPENDENT_AMBULATORY_CARE_PROVIDER_SITE_OTHER): Payer: Managed Care, Other (non HMO) | Admitting: Nurse Practitioner

## 2022-01-15 DIAGNOSIS — R059 Cough, unspecified: Secondary | ICD-10-CM | POA: Insufficient documentation

## 2022-01-15 DIAGNOSIS — J301 Allergic rhinitis due to pollen: Secondary | ICD-10-CM

## 2022-01-15 DIAGNOSIS — R051 Acute cough: Secondary | ICD-10-CM

## 2022-01-15 MED ORDER — AMOXICILLIN-POT CLAVULANATE 875-125 MG PO TABS: 1.0000 | ORAL_TABLET | Freq: Two times a day (BID) | ORAL | 0 refills | Status: AC

## 2022-01-15 MED ORDER — ALBUTEROL SULFATE HFA 108 (90 BASE) MCG/ACT IN AERS: 2.0000 | INHALATION_SPRAY | Freq: Four times a day (QID) | RESPIRATORY_TRACT | 0 refills | Status: DC | PRN

## 2022-01-15 NOTE — Patient Instructions (Addendum)
Flonase start daily.  Try Xyzal over the counter.  Mucinex as needed at night for acute cough.   ? ?Sinusitis, Adult ?Sinusitis is soreness and swelling (inflammation) of your sinuses. Sinuses are hollow spaces in the bones around your face. They are located: ?Around your eyes. ?In the middle of your forehead. ?Behind your nose. ?In your cheekbones. ?Your sinuses and nasal passages are lined with a fluid called mucus. Mucus drains out of your sinuses. Swelling can trap mucus in your sinuses. This lets germs (bacteria, virus, or fungus) grow, which leads to infection. Most of the time, this condition is caused by a virus. ?What are the causes? ?This condition is caused by: ?Allergies. ?Asthma. ?Germs. ?Things that block your nose or sinuses. ?Growths in the nose (nasal polyps). ?Chemicals or irritants in the air. ?Fungus (rare). ?What increases the risk? ?You are more likely to develop this condition if: ?You have a weak body defense system (immune system). ?You do a lot of swimming or diving. ?You use nasal sprays too much. ?You smoke. ?What are the signs or symptoms? ?The main symptoms of this condition are pain and a feeling of pressure around the sinuses. Other symptoms include: ?Stuffy nose (congestion). ?Runny nose (drainage). ?Swelling and warmth in the sinuses. ?Headache. ?Toothache. ?A cough that may get worse at night. ?Mucus that collects in the throat or the back of the nose (postnasal drip). ?Being unable to smell and taste. ?Being very tired (fatigue). ?A fever. ?Sore throat. ?Bad breath. ?How is this diagnosed? ?This condition is diagnosed based on: ?Your symptoms. ?Your medical history. ?A physical exam. ?Tests to find out if your condition is short-term (acute) or long-term (chronic). Your doctor may: ?Check your nose for growths (polyps). ?Check your sinuses using a tool that has a light (endoscope). ?Check for allergies or germs. ?Do imaging tests, such as an MRI or CT scan. ?How is this  treated? ?Treatment for this condition depends on the cause and whether it is short-term or long-term. ?If caused by a virus, your symptoms should go away on their own within 10 days. You may be given medicines to relieve symptoms. They include: ?Medicines that shrink swollen tissue in the nose. ?Medicines that treat allergies (antihistamines). ?A spray that treats swelling of the nostrils.  ?Rinses that help get rid of thick mucus in your nose (nasal saline washes). ?If caused by bacteria, your doctor may wait to see if you will get better without treatment. You may be given antibiotic medicine if you have: ?A very bad infection. ?A weak body defense system. ?If caused by growths in the nose, you may need to have surgery. ?Follow these instructions at home: ?Medicines ?Take, use, or apply over-the-counter and prescription medicines only as told by your doctor. These may include nasal sprays. ?If you were prescribed an antibiotic medicine, take it as told by your doctor. Do not stop taking the antibiotic even if you start to feel better. ?Hydrate and humidify ? ?Drink enough water to keep your pee (urine) pale yellow. ?Use a cool mist humidifier to keep the humidity level in your home above 50%. ?Breathe in steam for 10-15 minutes, 3-4 times a day, or as told by your doctor. You can do this in the bathroom while a hot shower is running. ?Try not to spend time in cool or dry air. ?Rest ?Rest as much as you can. ?Sleep with your head raised (elevated). ?Make sure you get enough sleep each night. ?General instructions ? ?Put a warm, moist  washcloth on your face 3-4 times a day, or as often as told by your doctor. This will help with discomfort. ?Wash your hands often with soap and water. If there is no soap and water, use hand sanitizer. ?Do not smoke. Avoid being around people who are smoking (secondhand smoke). ?Keep all follow-up visits as told by your doctor. This is important. ?Contact a doctor if: ?You have a  fever. ?Your symptoms get worse. ?Your symptoms do not get better within 10 days. ?Get help right away if: ?You have a very bad headache. ?You cannot stop throwing up (vomiting). ?You have very bad pain or swelling around your face or eyes. ?You have trouble seeing. ?You feel confused. ?Your neck is stiff. ?You have trouble breathing. ?Summary ?Sinusitis is swelling of your sinuses. Sinuses are hollow spaces in the bones around your face. ?This condition is caused by tissues in your nose that become inflamed or swollen. This traps germs. These can lead to infection. ?If you were prescribed an antibiotic medicine, take it as told by your doctor. Do not stop taking it even if you start to feel better. ?Keep all follow-up visits as told by your doctor. This is important. ?This information is not intended to replace advice given to you by your health care provider. Make sure you discuss any questions you have with your health care provider. ?Document Revised: 03/09/2018 Document Reviewed: 03/09/2018 ?Elsevier Patient Education ? 2022 [employer] ? ?

## 2022-01-15 NOTE — Assessment & Plan Note (Signed)
Chronic, ongoing.  Recommend she change to Xyzal and Flonase daily. Discussed length of time to use, during trigger seasons.   ?

## 2022-01-15 NOTE — Progress Notes (Addendum)
? ?BP 109/72   Pulse 83   Temp 98.8 ?F (37.1 ?C) (Oral)   Ht 5\' 5"  (1.651 m)   Wt 119 lb 9.6 oz (54.3 kg)   SpO2 100%   BMI 19.90 kg/m?   ? ?Subjective:  ? ? Patient ID: Elaine Cross, female    DOB: 25-Nov-1980, 41 y.o.   MRN: [id number] ? ?HPI: ?Elaine Cross is a 41 y.o. female ? ?Chief Complaint  ?Patient presents with  ? Cough  ? Nasal Congestion  ? ?UPPER RESPIRATORY TRACT INFECTION ?For one month has had sinus symptoms, no fevers or myalgias.  Her husband and son have had the same symptoms.  This past week she has started coughing.  Since moving to [place] she has developed allergies, she was placed on Allegra and this improved -- but this season Allegra not helping as much.  Lots of itchy throat. ? ?Did not Covid test at home.  Has not been Covid vaccinated.  ?Worst symptom: cough ?Fever: no ?Cough: yes ?Shortness of breath: no ?Wheezing: no ?Chest pain: no ?Chest tightness: no ?Chest congestion: no ?Nasal congestion: yes ?Runny nose: yes with greenish ?Post nasal drip: yes ?Sneezing: yes ?Sore throat:  only with the drainage ?Swollen glands: no ?Sinus pressure: yes had some ?Headache: yes slight on occasion ?Face pain: no ?Toothache: yes ?Ear pain: a little in past couple weeks, now gone ?Ear pressure: none ?Eyes red/itching:no ?Eye drainage/crusting: no  ?Vomiting: no ?Rash: no ?Fatigue: yes ?Sick contacts: no ?Strep contacts: no  ?Context: fluctuating ?Recurrent sinusitis: no ?Relief with OTC cold/cough medications: no  ?Treatments attempted: Allegra   ? ?Relevant past medical, surgical, family and social history reviewed and updated as indicated. Interim medical history since our last visit reviewed. ?Allergies and medications reviewed and updated. ? ?Review of Systems  ?Constitutional:  Positive for fatigue. Negative for activity change, appetite change, chills and fever.  ?HENT:  Positive for congestion, postnasal drip, rhinorrhea, sinus pressure and sore throat. Negative for ear  discharge, ear pain, facial swelling, sinus pain, sneezing and voice change.   ?Eyes:  Negative for pain and visual disturbance.  ?Respiratory:  Positive for cough. Negative for chest tightness, shortness of breath and wheezing.   ?Cardiovascular:  Negative for chest pain, palpitations and leg swelling.  ?Gastrointestinal: Negative.   ?Endocrine: Negative.   ?Musculoskeletal:  Negative for myalgias.  ?Neurological: Negative.   ?Psychiatric/Behavioral: Negative.    ? ?Per HPI unless specifically indicated above ? ?   ?Objective:  ?  ?BP 109/72   Pulse 83   Temp 98.8 ?F (37.1 ?C) (Oral)   Ht 5\' 5"  (1.651 m)   Wt 119 lb 9.6 oz (54.3 kg)   SpO2 100%   BMI 19.90 kg/m?   ?Wt Readings from Last 3 Encounters:  ?01/15/22 119 lb 9.6 oz (54.3 kg)  ?12/03/21 121 lb 6.4 oz (55.1 kg)  ?09/17/21 122 lb 12.8 oz (55.7 kg)  ?  ?Physical Exam ?Vitals and nursing note reviewed.  ?Constitutional:   ?   General: She is awake. She is not in acute distress. ?   Appearance: She is well-developed and well-groomed. She is not ill-appearing or toxic-appearing.  ?HENT:  ?   Head: Normocephalic.  ?   Right Ear: Hearing, ear canal and external ear normal. No drainage. A middle ear effusion is present.  ?   Left Ear: Hearing, ear canal and external ear normal. No drainage. A middle ear effusion is present.  ?   Nose: Rhinorrhea present.  Rhinorrhea is clear.  ?   Right Sinus: Frontal sinus tenderness present. No maxillary sinus tenderness.  ?   Left Sinus: Frontal sinus tenderness present. No maxillary sinus tenderness.  ?   Mouth/Throat:  ?   Mouth: Mucous membranes are moist.  ?   Pharynx: Posterior oropharyngeal erythema (moderate cobblestone pattern) present. No pharyngeal swelling or oropharyngeal exudate.  ?   Tonsils: 1+ on the right. 1+ on the left.  ?Eyes:  ?   General: Lids are normal.     ?   Right eye: No discharge.     ?   Left eye: No discharge.  ?   Conjunctiva/sclera: Conjunctivae normal.  ?   Pupils: Pupils are equal, round,  and reactive to light.  ?Neck:  ?   Thyroid: No thyromegaly.  ?   Vascular: No carotid bruit.  ?Cardiovascular:  ?   Rate and Rhythm: Normal rate and regular rhythm.  ?   Heart sounds: Normal heart sounds. No murmur heard. ?  No gallop.  ?Pulmonary:  ?   Effort: Pulmonary effort is normal. No accessory muscle usage or respiratory distress.  ?   Breath sounds: Normal breath sounds.  ?   Comments: Intermittent non productive cough present ?Abdominal:  ?   General: Bowel sounds are normal.  ?   Palpations: Abdomen is soft.  ?Musculoskeletal:  ?   Cervical back: Normal range of motion and neck supple.  ?   Right lower leg: No edema.  ?   Left lower leg: No edema.  ?Lymphadenopathy:  ?   Cervical: No cervical adenopathy.  ?Skin: ?   General: Skin is warm and dry.  ?Neurological:  ?   Mental Status: She is alert and oriented to person, place, and time.  ?Psychiatric:     ?   Attention and Perception: Attention normal.     ?   Mood and Affect: Mood normal.     ?   Speech: Speech normal.     ?   Behavior: Behavior normal. Behavior is cooperative.     ?   Thought Content: Thought content normal.  ? ? ?Results for orders placed or performed in visit on 12/03/21  ?T4, free  ?Result Value Ref Range  ? Free T4 1.29 0.82 - 1.77 ng/dL  ?TSH  ?Result Value Ref Range  ? TSH 3.600 0.450 - 4.500 uIU/mL  ? ?   ?Assessment & Plan:  ? ?Problem List Items Addressed This Visit   ? ?  ? Respiratory  ? Allergic rhinitis  ?  Chronic, ongoing.  Recommend she change to Xyzal and Flonase daily. Discussed length of time to use, during trigger seasons.   ?  ?  ?  ? Other  ? Cough  ?  For 4 weeks, no Covid testing.  At this point due to period of time will not obtain testing as is past treatment period.  Recommend she start Xyzal and Flonase daily.  Will send in script for Augmentin & Albuterol PRN, avoid Prednisone as is allergic. Ensure to use double protection, condoms, for next two weeks due to BCP and abx.  Recommend: ?- Increased rest ?-  Increasing Fluids ?- Acetaminophen / ibuprofen as needed for fever/pain.  ?- Salt water gargling, chloraseptic spray and throat lozenges ?- Mucinex.  ?- Humidifying the air. ?Return to office for worsening or ongoing. ?  ?  ?  ? ?Follow up plan: ?Return if symptoms worsen or fail to improve. ? ? ? ? ? ?

## 2022-01-15 NOTE — Assessment & Plan Note (Addendum)
For 4 weeks, no Covid testing.  At this point due to period of time will not obtain testing as is past treatment period.  Recommend she start Xyzal and Flonase daily.  Will send in script for Augmentin & Albuterol PRN, avoid Prednisone as is allergic. Ensure to use double protection, condoms, for next two weeks due to BCP and abx.  Recommend: ?- Increased rest ?- Increasing Fluids ?- Acetaminophen / ibuprofen as needed for fever/pain.  ?- Salt water gargling, chloraseptic spray and throat lozenges ?- Mucinex.  ?- Humidifying the air. ?Return to office for worsening or ongoing. ?

## 2022-01-31 ENCOUNTER — Encounter: Payer: Self-pay | Admitting: Nurse Practitioner

## 2022-01-31 ENCOUNTER — Ambulatory Visit (INDEPENDENT_AMBULATORY_CARE_PROVIDER_SITE_OTHER): Payer: Managed Care, Other (non HMO) | Admitting: Nurse Practitioner

## 2022-01-31 VITALS — BP 117/78 | HR 78 | Temp 98.5°F | Ht 65.0 in | Wt 120.4 lb

## 2022-01-31 DIAGNOSIS — R399 Unspecified symptoms and signs involving the genitourinary system: Secondary | ICD-10-CM | POA: Diagnosis not present

## 2022-01-31 DIAGNOSIS — R8281 Pyuria: Secondary | ICD-10-CM | POA: Diagnosis not present

## 2022-01-31 LAB — WET PREP FOR TRICH, YEAST, CLUE
Clue Cell Exam: NEGATIVE
Trichomonas Exam: NEGATIVE
Yeast Exam: NEGATIVE

## 2022-01-31 LAB — URINALYSIS, ROUTINE W REFLEX MICROSCOPIC
Bilirubin, UA: NEGATIVE
Glucose, UA: NEGATIVE
Ketones, UA: NEGATIVE
Leukocytes,UA: NEGATIVE
Nitrite, UA: NEGATIVE
Protein,UA: NEGATIVE
Specific Gravity, UA: 1.015 (ref 1.005–1.030)
Urobilinogen, Ur: 0.2 mg/dL (ref 0.2–1.0)
pH, UA: 7 (ref 5.0–7.5)

## 2022-01-31 LAB — MICROSCOPIC EXAMINATION
Bacteria, UA: NONE SEEN
WBC, UA: NONE SEEN /hpf (ref 0–5)

## 2022-01-31 NOTE — Progress Notes (Signed)
? ?BP 117/78   Pulse 78   Temp 98.5 ?F (36.9 ?C) (Oral)   Ht 5\' 5"  (1.651 m)   Wt 120 lb 6.4 oz (54.6 kg)   SpO2 99%   BMI 20.04 kg/m?   ? ?Subjective:  ? ? Patient ID: Elaine Cross, female    DOB: 1980/12/08, 41 y.o.   MRN: [id number] ? ?HPI: ?Elaine Cross is a 41 y.o. female ? ?Chief Complaint  ?Patient presents with  ? burning during urination  ?  Started last week with pain in lower back and frequency and the burning with urination started on Sunday.   ? ?URINARY SYMPTOMS ?Symptoms started last week -- has been fluctuating.  There was some blood in it yesterday. ?Dysuria: burning ?Urinary frequency: yes ?Urgency: yes ?Small volume voids: no ?Symptom severity: yes ?Urinary incontinence: no ?Foul odor: no ?Hematuria: yes ?Abdominal pain: no ?Back pain:  a little bit ?Suprapubic pain/pressure: no ?Flank pain: no ?Fever:  no ?Vomiting: no ?Status: stable ?Previous urinary tract infection: yes ?Recurrent urinary tract infection: no ?Sexual activity: monogamous ?History of sexually transmitted disease: no ?Treatments attempted: increasing fluids   ? ?Relevant past medical, surgical, family and social history reviewed and updated as indicated. Interim medical history since our last visit reviewed. ?Allergies and medications reviewed and updated. ? ?Review of Systems  ?Constitutional:  Negative for activity change, appetite change, diaphoresis, fatigue and fever.  ?Respiratory:  Negative for cough, chest tightness and shortness of breath.   ?Cardiovascular:  Negative for chest pain, palpitations and leg swelling.  ?Gastrointestinal: Negative.   ?Genitourinary:  Positive for frequency, hematuria and urgency. Negative for difficulty urinating, flank pain, pelvic pain, vaginal bleeding and vaginal discharge.  ?Neurological: Negative.   ?Psychiatric/Behavioral: Negative.    ? ?Per HPI unless specifically indicated above ? ?   ?Objective:  ?  ?BP 117/78   Pulse 78   Temp 98.5 ?F (36.9 ?C) (Oral)    Ht 5\' 5"  (1.651 m)   Wt 120 lb 6.4 oz (54.6 kg)   SpO2 99%   BMI 20.04 kg/m?   ?Wt Readings from Last 3 Encounters:  ?01/31/22 120 lb 6.4 oz (54.6 kg)  ?01/15/22 119 lb 9.6 oz (54.3 kg)  ?12/03/21 121 lb 6.4 oz (55.1 kg)  ?  ?Physical Exam ?Vitals and nursing note reviewed.  ?Constitutional:   ?   General: She is awake. She is not in acute distress. ?   Appearance: She is well-developed and well-groomed. She is not ill-appearing or toxic-appearing.  ?HENT:  ?   Head: Normocephalic.  ?   Right Ear: Hearing normal.  ?   Left Ear: Hearing normal.  ?Eyes:  ?   General: Lids are normal.     ?   Right eye: No discharge.     ?   Left eye: No discharge.  ?   Conjunctiva/sclera: Conjunctivae normal.  ?   Pupils: Pupils are equal, round, and reactive to light.  ?Neck:  ?   Vascular: No carotid bruit.  ?Cardiovascular:  ?   Rate and Rhythm: Normal rate and regular rhythm.  ?   Heart sounds: Normal heart sounds. No murmur heard. ?  No gallop.  ?Pulmonary:  ?   Effort: Pulmonary effort is normal. No accessory muscle usage or respiratory distress.  ?   Breath sounds: Normal breath sounds.  ?Abdominal:  ?   General: Bowel sounds are normal. There is no distension.  ?   Palpations: Abdomen is soft.  ?  Tenderness: There is no abdominal tenderness. There is no right CVA tenderness or left CVA tenderness.  ?Musculoskeletal:  ?   Cervical back: Normal range of motion and neck supple.  ?   Right lower leg: No edema.  ?   Left lower leg: No edema.  ?Lymphadenopathy:  ?   Cervical: No cervical adenopathy.  ?Skin: ?   General: Skin is warm and dry.  ?Neurological:  ?   Mental Status: She is alert and oriented to person, place, and time.  ?Psychiatric:     ?   Attention and Perception: Attention normal.     ?   Mood and Affect: Mood normal.     ?   Speech: Speech normal.     ?   Behavior: Behavior normal. Behavior is cooperative.     ?   Thought Content: Thought content normal.  ? ? ?Results for orders placed or performed in visit on  12/03/21  ?T4, free  ?Result Value Ref Range  ? Free T4 1.29 0.82 - 1.77 ng/dL  ?TSH  ?Result Value Ref Range  ? TSH 3.600 0.450 - 4.500 uIU/mL  ? ?   ?Assessment & Plan:  ? ?Problem List Items Addressed This Visit   ? ?  ? Other  ? Urinary tract infection symptoms - Primary  ?  Acute for one week symptoms.  UA trace BLD, no other findings, and wet prep negative. At this time will not treat, but due to symptoms will send for culture to ensure no findings. Recommend increased hydration and take cranberry tablets at home + Azo if burning.  If culture returns positive will treat.  Return to office if symptoms ongoing. ?  ?  ? Relevant Orders  ? Urinalysis, Routine w reflex microscopic  ? WET PREP FOR TRICH, YEAST, CLUE  ? ?Other Visit Diagnoses   ? ? Pyuria      ? Will send urine for culture.  ? Relevant Orders  ? Urine Culture  ? ?  ?  ? ?Follow up plan: ?Return if symptoms worsen or fail to improve. ? ? ? ? ? ?

## 2022-01-31 NOTE — Patient Instructions (Signed)
Start taking cranberry tablets daily and take Azo as needed. ? ? ?Urinary Tract Infection, Adult ?A urinary tract infection (UTI) is an infection of any part of the urinary tract. The urinary tract includes: ?The kidneys. ?The ureters. ?The bladder. ?The urethra. ?These organs make, store, and get rid of pee (urine) in the body. ?What are the causes? ?This infection is caused by germs (bacteria) in your genital area. These germs grow and cause swelling (inflammation) of your urinary tract. ?What increases the risk? ?The following factors may make you more likely to develop this condition: ?Using a small, thin tube (catheter) to drain pee. ?Not being able to control when you pee or poop (incontinence). ?Being female. If you are female, these things can increase the risk: ?Using these methods to prevent pregnancy: ?A medicine that kills sperm (spermicide). ?A device that blocks sperm (diaphragm). ?Having low levels of a female hormone (estrogen). ?Being pregnant. ?You are more likely to develop this condition if: ?You have genes that add to your risk. ?You are sexually active. ?You take antibiotic medicines. ?You have trouble peeing because of: ?A prostate that is bigger than normal, if you are female. ?A blockage in the part of your body that drains pee from the bladder. ?A kidney stone. ?A nerve condition that affects your bladder. ?Not getting enough to drink. ?Not peeing often enough. ?You have other conditions, such as: ?Diabetes. ?A weak disease-fighting system (immune system). ?Sickle cell disease. ?Gout. ?Injury of the spine. ?What are the signs or symptoms? ?Symptoms of this condition include: ?Needing to pee right away. ?Peeing small amounts often. ?Pain or burning when peeing. ?Blood in the pee. ?Pee that smells bad or not like normal. ?Trouble peeing. ?Pee that is cloudy. ?Fluid coming from the vagina, if you are female. ?Pain in the belly or lower back. ?Other symptoms include: ?Vomiting. ?Not feeling  hungry. ?Feeling mixed up (confused). This may be the first symptom in older adults. ?Being tired and grouchy (irritable). ?A fever. ?Watery poop (diarrhea). ?How is this treated? ?Taking antibiotic medicine. ?Taking other medicines. ?Drinking enough water. ?In some cases, you may need to see a specialist. ?Follow these instructions at home: ?Medicines ?Take over-the-counter and prescription medicines only as told by your doctor. ?If you were prescribed an antibiotic medicine, take it as told by your doctor. Do not stop taking it even if you start to feel better. ?General instructions ?Make sure you: ?Pee until your bladder is empty. ?Do not hold pee for a long time. ?Empty your bladder after sex. ?Wipe from front to back after peeing or pooping if you are a female. Use each tissue one time when you wipe. ?Drink enough fluid to keep your pee pale yellow. ?Keep all follow-up visits. ?Contact a doctor if: ?You do not get better after 1-2 days. ?Your symptoms go away and then come back. ?Get help right away if: ?You have very bad back pain. ?You have very bad pain in your lower belly. ?You have a fever. ?You have chills. ?You feeling like you will vomit or you vomit. ?Summary ?A urinary tract infection (UTI) is an infection of any part of the urinary tract. ?This condition is caused by germs in your genital area. ?There are many risk factors for a UTI. ?Treatment includes antibiotic medicines. ?Drink enough fluid to keep your pee pale yellow. ?This information is not intended to replace advice given to you by your health care provider. Make sure you discuss any questions you have with your health care  provider. ?Document Revised: 05/19/2020 Document Reviewed: 05/19/2020 ?Elsevier Patient Education ? 2022 [employer] ? ?

## 2022-01-31 NOTE — Assessment & Plan Note (Signed)
Acute for one week symptoms.  UA trace BLD, no other findings, and wet prep negative. At this time will not treat, but due to symptoms will send for culture to ensure no findings. Recommend increased hydration and take cranberry tablets at home + Azo if burning.  If culture returns positive will treat.  Return to office if symptoms ongoing. ?

## 2022-02-03 LAB — URINE CULTURE

## 2022-02-03 NOTE — Progress Notes (Signed)
Contacted via MyChart ? ?Good evening Elaine Cross, your urine has returned and only shows 50,000 to 100,00 growth.  We do not often treat unless growth >100,000.  I suspect at this time, as we discussed, we are clearing on your own.  Continue increased hydration and taking in some Azo and Vitamin C tablets.  Any questions? If worsening symptoms return to see me. ?Keep being awesome!!  Thank you for allowing me to participate in your care.  I appreciate you. ?Kindest regards, ?Imoni Kohen

## 2022-02-04 ENCOUNTER — Encounter: Payer: Managed Care, Other (non HMO) | Admitting: Certified Nurse Midwife

## 2022-02-06 ENCOUNTER — Encounter: Payer: Self-pay | Admitting: Nurse Practitioner

## 2022-03-04 ENCOUNTER — Encounter: Payer: 59 | Admitting: Certified Nurse Midwife

## 2022-03-20 ENCOUNTER — Telehealth: Payer: Self-pay | Admitting: Certified Nurse Midwife

## 2022-03-20 ENCOUNTER — Other Ambulatory Visit: Payer: Self-pay

## 2022-03-20 MED ORDER — SLYND 4 MG PO TABS: 1.0000 | ORAL_TABLET | Freq: Every day | ORAL | 4 refills | Status: DC

## 2022-03-20 NOTE — Telephone Encounter (Signed)
Left message making pt aware. 

## 2022-03-20 NOTE — Telephone Encounter (Signed)
Refill sent.

## 2022-03-20 NOTE — Telephone Encounter (Signed)
Pt called stating that her physical got rescheduled to 8/9- she is requesting refill on her S[facility] rx through [facility]. Please advise.

## 2022-05-29 ENCOUNTER — Encounter: Payer: Self-pay | Admitting: Certified Nurse Midwife

## 2022-05-29 ENCOUNTER — Ambulatory Visit (INDEPENDENT_AMBULATORY_CARE_PROVIDER_SITE_OTHER): Payer: Managed Care, Other (non HMO) | Admitting: Certified Nurse Midwife

## 2022-05-29 VITALS — BP 125/79 | HR 75 | Ht 65.0 in | Wt 123.2 lb

## 2022-05-29 DIAGNOSIS — Z01419 Encounter for gynecological examination (general) (routine) without abnormal findings: Secondary | ICD-10-CM | POA: Diagnosis not present

## 2022-05-29 DIAGNOSIS — Z1231 Encounter for screening mammogram for malignant neoplasm of breast: Secondary | ICD-10-CM

## 2022-05-29 MED ORDER — SLYND 4 MG PO TABS: 1.0000 | ORAL_TABLET | Freq: Every day | ORAL | 4 refills | Status: DC

## 2022-05-29 NOTE — Progress Notes (Signed)
GYNECOLOGY ANNUAL PREVENTATIVE CARE ENCOUNTER NOTE  History:     Elaine Cross is a 41 y.o. [id number] female here for a routine annual gynecologic exam.  Current complaints: none.   Denies abnormal vaginal bleeding, discharge, pelvic pain, problems with intercourse or other gynecologic concerns.     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 (Menstrual status: Oral contraceptives). Contraception: oral progesterone-only contraceptive Last Pap: 03/02/2021. Results were: normal with negative HPV Last mammogram: 02/2021. Results were: normal   Upstream - 05/29/22 1051       Pregnancy Intention Screening   Does the patient want to become pregnant in the next year? No    Does the patient's partner want to become pregnant in the next year? No    Would the patient like to discuss contraceptive options today? No            The pregnancy intention screening data noted above was reviewed. Potential methods of contraception were discussed. The patient elected to proceed with pop.  Obstetric History OB History  Gravida Para Term Preterm AB Living  3       1 2   SAB IAB Ectopic Multiple Live Births  1            # Outcome Date GA Lbr Len/2nd Weight Sex Delivery Anes PTL Lv  3 Gravida           2 Gravida           1 SAB             Past Medical History:  Diagnosis Date   Hypothyroidism     Past Surgical History:  Procedure Laterality Date   MOUTH SURGERY      Current Outpatient Medications on File Prior to Visit  Medication Sig Dispense Refill   albuterol (VENTOLIN HFA) 108 (90 Base) MCG/ACT inhaler Inhale 2 puffs into the lungs every 6 (six) hours as needed for wheezing or shortness of breath. 18 g 0   Drospirenone (SLYND) 4 MG TABS Take 1 tablet by mouth daily. 84 tablet 4   levocetirizine (XYZAL) 5 MG tablet Take 5 mg  by mouth every evening.     levothyroxine (SYNTHROID) 25 MCG tablet Take 1 tablet (25 mcg total) by mouth daily. 90 tablet 4   Prenatal Vit-Fe Fumarate-FA (PRENATAL MULTIVITAMIN) TABS tablet Take 1 tablet by mouth daily at 12 noon.     No current facility-administered medications on file prior to visit.    Allergies  Allergen Reactions   Ibuprofen Hives   Other    Prednisone Other (See Comments)    Severe low blood pressure, night sweats    Social History:  reports that she has never smoked. She has never used smokeless tobacco. She reports that she does not drink alcohol and does not use drugs.  Family History  Problem Relation Age of Onset   Thyroid disease Mother    Hypertension Father    Depression Brother    Healthy Brother    Breast cancer Paternal Aunt 30   Dementia Maternal Grandmother    Heart disease Maternal Grandmother    Heart attack Maternal Grandfather    Gastric cancer Paternal Grandmother     The following portions of the patient's history were reviewed and updated as appropriate: allergies, current medications, past family history, past medical history, past social history, past surgical history and  problem list.  Review of Systems Pertinent items noted in HPI and remainder of comprehensive ROS otherwise negative.  Physical Exam:  BP 125/79   Pulse 75   Ht 5\' 5"  (1.651 m)   Wt 123 lb 3.2 oz (55.9 kg)   LMP  (LMP Unknown)   BMI 20.50 kg/m  CONSTITUTIONAL: Well-developed, well-nourished female in no acute distress.  HENT:  Normocephalic, atraumatic, External right and left ear normal. Oropharynx is clear and moist EYES: Conjunctivae and EOM are normal. Pupils are equal, round, and reactive to light. No scleral icterus.  NECK: Normal range of motion, supple, no masses.  Normal thyroid.  SKIN: Skin is warm and dry. No rash noted. Not diaphoretic. No erythema. No pallor. MUSCULOSKELETAL: Normal range of motion. No tenderness.  No cyanosis, clubbing, or  edema.  2+ distal pulses. NEUROLOGIC: Alert and oriented to person, place, and time. Normal reflexes, muscle tone coordination.  PSYCHIATRIC: Normal mood and affect. Normal behavior. Normal judgment and thought content. CARDIOVASCULAR: Normal heart rate noted, regular rhythm RESPIRATORY: Clear to auscultation bilaterally. Effort and breath sounds normal, no problems with respiration noted. BREASTS: Symmetric in size. No masses, tenderness, skin changes, nipple drainage, or lymphadenopathy bilaterally.  ABDOMEN: Soft, no distention noted.  No tenderness, rebound or guarding.  PELVIC: Normal appearing external genitalia and urethral meatus; normal appearing vaginal mucosa and cervix.  No abnormal discharge noted.  Pap smear not due.  Normal uterine size, no other palpable masses, no uterine or adnexal tenderness.  .   Assessment and Plan:    1. Well woman exam with routine gynecological exam     Pap: not due  Mammogram : ordered Labs: none Refills: POP Referral: none Routine preventative health maintenance measures emphasized. Please refer to After Visit Summary for other counseling recommendations.      , CNM [facility],  [facility]

## 2022-06-01 NOTE — Patient Instructions (Signed)

## 2022-06-03 ENCOUNTER — Ambulatory Visit (INDEPENDENT_AMBULATORY_CARE_PROVIDER_SITE_OTHER): Payer: Managed Care, Other (non HMO) | Admitting: Nurse Practitioner

## 2022-06-03 ENCOUNTER — Encounter: Payer: Self-pay | Admitting: Nurse Practitioner

## 2022-06-03 VITALS — BP 124/80 | HR 74 | Temp 97.7°F | Wt 124.9 lb

## 2022-06-03 DIAGNOSIS — E039 Hypothyroidism, unspecified: Secondary | ICD-10-CM | POA: Diagnosis not present

## 2022-06-03 NOTE — Progress Notes (Signed)
BP 124/80   Pulse 74   Temp 97.7 F (36.5 C) (Oral)   Wt 124 lb 14.4 oz (56.7 kg)   LMP  (LMP Unknown)   SpO2 98%   BMI 20.78 kg/m    Subjective:    Patient ID: Elaine Cross, female    DOB: 02/12/1981, 41 y.o.   MRN: [id number]  HPI: Elaine Cross is a 41 y.o. female  Chief Complaint  Patient presents with   Hypothyroidism    6 month follow up    HYPOTHYROIDISM Continues on Levothyroxine 25 MCG.   Thyroid control status:stable Satisfied with current treatment? yes Medication side effects: no Medication compliance: good compliance Etiology of hypothyroidism: unknown Recent dose adjustment:no Fatigue: no Cold intolerance: no Heat intolerance: no Weight gain: no Weight loss: no Constipation: no Diarrhea/loose stools: no Palpitations: no Lower extremity edema: no Anxiety/depressed mood: no   Relevant past medical, surgical, family and social history reviewed and updated as indicated. Interim medical history since our last visit reviewed. Allergies and medications reviewed and updated.  Review of Systems  Constitutional:  Negative for activity change, appetite change, diaphoresis, fatigue and fever.  Respiratory:  Negative for cough, chest tightness and shortness of breath.   Cardiovascular:  Negative for chest pain, palpitations and leg swelling.  Gastrointestinal: Negative.   Neurological: Negative.   Psychiatric/Behavioral: Negative.      Per HPI unless specifically indicated above     Objective:    BP 124/80   Pulse 74   Temp 97.7 F (36.5 C) (Oral)   Wt 124 lb 14.4 oz (56.7 kg)   LMP  (LMP Unknown)   SpO2 98%   BMI 20.78 kg/m   Wt Readings from Last 3 Encounters:  06/03/22 124 lb 14.4 oz (56.7 kg)  05/29/22 123 lb 3.2 oz (55.9 kg)  01/31/22 120 lb 6.4 oz (54.6 kg)    Physical Exam Vitals and nursing note reviewed.  Constitutional:      General: She is awake. She is not in acute distress.    Appearance: She is  well-developed and well-groomed. She is not ill-appearing or toxic-appearing.  HENT:     Head: Normocephalic.     Right Ear: Hearing normal.     Left Ear: Hearing normal.  Eyes:     General: Lids are normal.        Right eye: No discharge.        Left eye: No discharge.     Conjunctiva/sclera: Conjunctivae normal.     Pupils: Pupils are equal, round, and reactive to light.  Neck:     Thyroid: No thyromegaly.     Vascular: No carotid bruit.  Cardiovascular:     Rate and Rhythm: Normal rate and regular rhythm.     Heart sounds: Normal heart sounds. No murmur heard.    No gallop.  Pulmonary:     Effort: Pulmonary effort is normal. No accessory muscle usage or respiratory distress.     Breath sounds: Normal breath sounds.  Abdominal:     General: Bowel sounds are normal.     Palpations: Abdomen is soft. There is no hepatomegaly or splenomegaly.  Musculoskeletal:     Cervical back: Normal range of motion and neck supple.     Right lower leg: No edema.     Left lower leg: No edema.  Lymphadenopathy:     Cervical: No cervical adenopathy.  Skin:    General: Skin is warm and dry.  Neurological:  Mental Status: She is alert and oriented to person, place, and time.     Deep Tendon Reflexes: Reflexes are normal and symmetric.     Reflex Scores:      Brachioradialis reflexes are 2+ on the right side and 2+ on the left side.      Patellar reflexes are 2+ on the right side and 2+ on the left side. Psychiatric:        Attention and Perception: Attention normal.        Mood and Affect: Mood normal.        Speech: Speech normal.        Behavior: Behavior normal. Behavior is cooperative.        Thought Content: Thought content normal.    Results for orders placed or performed in visit on 01/31/22  Urine Culture   Specimen: Urine   UR  Result Value Ref Range   Urine Culture, Routine Final report    Organism ID, Bacteria Lactobacillus species   Microscopic Examination  Result  Value Ref Range   WBC, UA None seen 0 - 5 /hpf   RBC, Urine 0-2 0 - 2 /hpf   Epithelial Cells (non renal) 0-10 0 - 10 /hpf   Bacteria, UA None seen None seen/Few  WET PREP FOR TRICH, YEAST, CLUE   Urine  Result Value Ref Range   Trichomonas Exam Negative Negative   Yeast Exam Negative Negative   Clue Cell Exam Negative Negative  Urinalysis, Routine w reflex microscopic  Result Value Ref Range   Specific Gravity, UA 1.015 1.005 - 1.030   pH, UA 7.0 5.0 - 7.5   Color, UA Yellow Yellow   Appearance Ur Clear Clear   Leukocytes,UA Negative Negative   Protein,UA Negative Negative/Trace   Glucose, UA Negative Negative   Ketones, UA Negative Negative   RBC, UA Trace (A) Negative   Bilirubin, UA Negative Negative   Urobilinogen, Ur 0.2 0.2 - 1.0 mg/dL   Nitrite, UA Negative Negative   Microscopic Examination See below:       Assessment & Plan:   Problem List Items Addressed This Visit       Endocrine   Hypothyroid - Primary    Diagnosed 10/17/21, stale.  Continue Levothyroxine dosing at this time and adjust based on labs.  Free T4 and TSH today.      Relevant Orders   T4, free   TSH     Follow up plan: Return in about 6 months (around 12/04/2022) for Annual physical.

## 2022-06-03 NOTE — Assessment & Plan Note (Signed)
Diagnosed 10/17/21, stale.  Continue Levothyroxine dosing at this time and adjust based on labs.  Free T4 and TSH today.

## 2022-06-04 ENCOUNTER — Encounter: Payer: Self-pay | Admitting: Nurse Practitioner

## 2022-06-04 LAB — TSH: TSH: 4.32 u[IU]/mL (ref 0.450–4.500)

## 2022-06-04 LAB — T4, FREE: Free T4: 1.38 ng/dL (ref 0.82–1.77)

## 2022-06-04 NOTE — Progress Notes (Signed)
Contacted via MyChart   Good morning Elaine Cross, your labs have returned and remain in stable range -- however have trended up a little.  Continue Levothyroxine every morning.  If you start feeling more sluggish let me know and we may recheck before next visit to see if dose needs to be adjusted.  Any questions? Keep being stellar!!  Thank you for allowing me to participate in your care.  I appreciate you. Kindest regards, Mingo Siegert

## 2022-06-20 ENCOUNTER — Ambulatory Visit
Admission: RE | Admit: 2022-06-20 | Discharge: 2022-06-20 | Disposition: A | Discharge status: Home / Self Care | Payer: Managed Care, Other (non HMO) | Source: Ambulatory Visit | Attending: Certified Nurse Midwife | Admitting: Certified Nurse Midwife

## 2022-06-20 DIAGNOSIS — Z1231 Encounter for screening mammogram for malignant neoplasm of breast: Secondary | ICD-10-CM | POA: Insufficient documentation

## 2022-06-21 ENCOUNTER — Other Ambulatory Visit: Payer: Self-pay | Admitting: Certified Nurse Midwife

## 2022-06-21 ENCOUNTER — Telehealth: Payer: Self-pay | Admitting: Certified Nurse Midwife

## 2022-06-21 DIAGNOSIS — R921 Mammographic calcification found on diagnostic imaging of breast: Secondary | ICD-10-CM

## 2022-06-21 DIAGNOSIS — R928 Other abnormal and inconclusive findings on diagnostic imaging of breast: Secondary | ICD-10-CM

## 2022-06-21 NOTE — Telephone Encounter (Signed)
Patient called crying and states she had a mammogram done today 06/20/22 and the results was abnormal in the R breast. Patient is nervous and wanted to receive a call back to know exactly what the results are. Patient states they only told her there was calcifications on the right side. Pt states test always come back dense. Please advise.

## 2022-06-25 ENCOUNTER — Encounter: Payer: Self-pay | Admitting: Certified Nurse Midwife

## 2022-06-25 ENCOUNTER — Other Ambulatory Visit: Payer: Self-pay | Admitting: Certified Nurse Midwife

## 2022-06-25 ENCOUNTER — Ambulatory Visit
Admission: RE | Admit: 2022-06-25 | Discharge: 2022-06-25 | Disposition: A | Discharge status: Home / Self Care | Payer: Managed Care, Other (non HMO) | Source: Ambulatory Visit | Attending: Certified Nurse Midwife | Admitting: Certified Nurse Midwife

## 2022-06-25 DIAGNOSIS — R921 Mammographic calcification found on diagnostic imaging of breast: Secondary | ICD-10-CM | POA: Diagnosis present

## 2022-06-25 DIAGNOSIS — R928 Other abnormal and inconclusive findings on diagnostic imaging of breast: Secondary | ICD-10-CM | POA: Insufficient documentation

## 2022-08-27 IMAGING — MG MM DIGITAL SCREENING BILAT W/ TOMO AND CAD
8 series · 9 of 24 positions shown · non-contrast
Comparison: None.

CLINICAL DATA: Screening.

EXAM:
DIGITAL SCREENING BILATERAL MAMMOGRAM WITH TOMOSYNTHESIS AND CAD
TECHNIQUE: Bilateral screening digital craniocaudal and mediolateral oblique
mammograms were obtained. Bilateral screening digital breast
tomosynthesis was performed. The images were evaluated with
computer-aided detection.

[L MLO synth-2D]
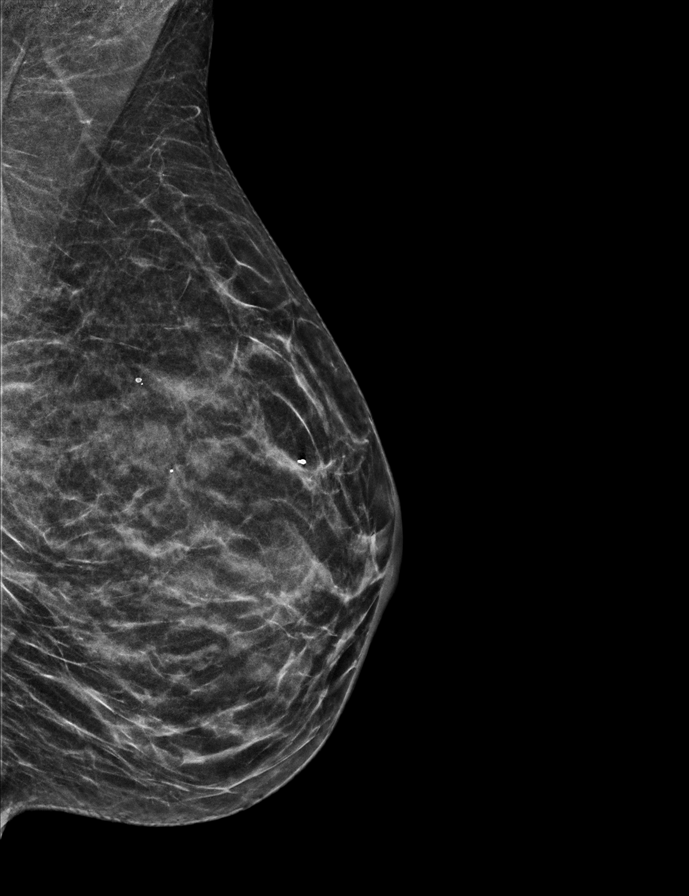

[R MLO synth-2D]
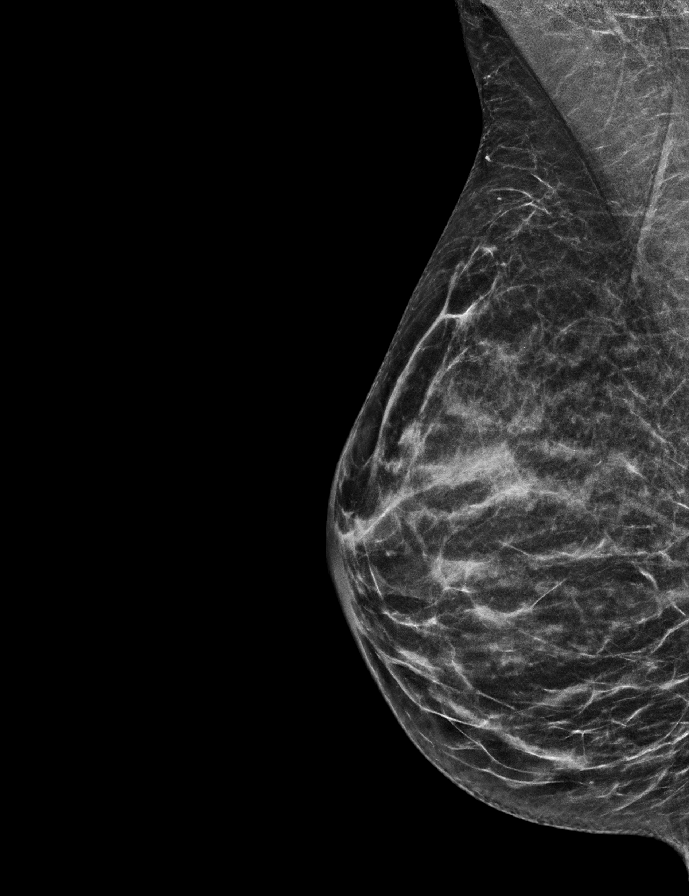

[R CC synth-2D]
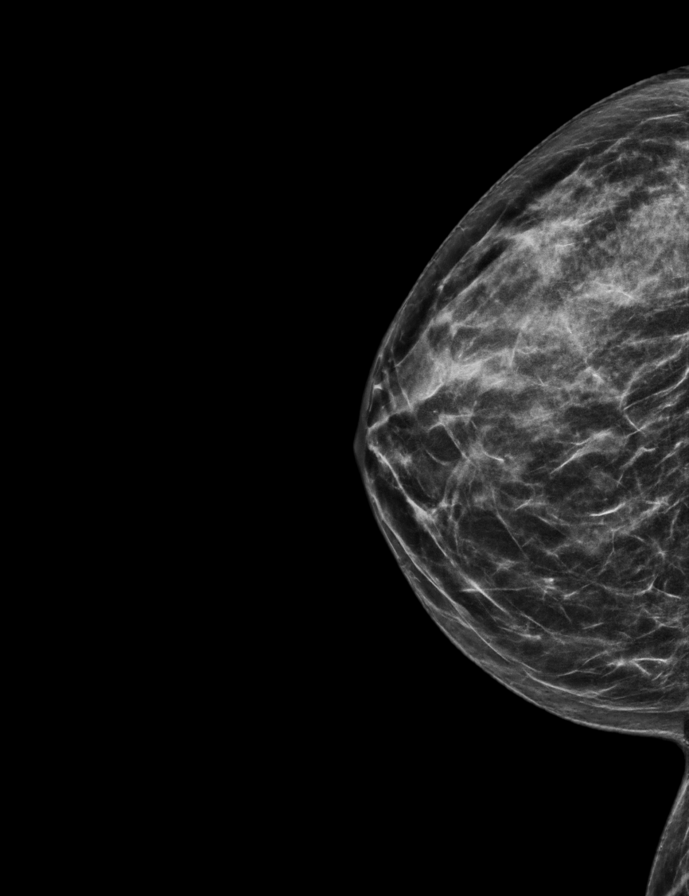

[L CC synth-2D]
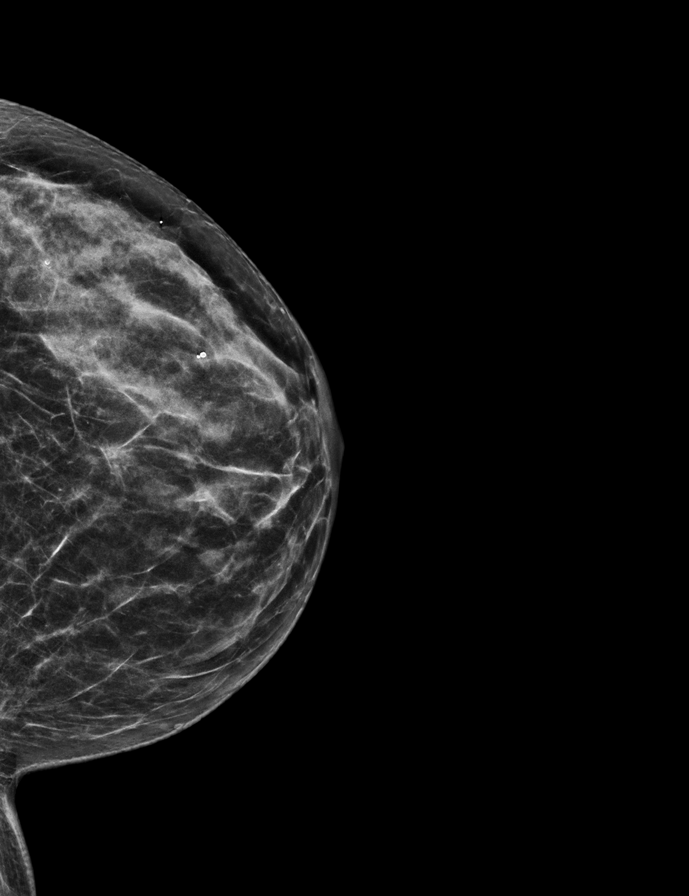

[L MLO tomo · 2 of 42 frames shown]
[frame 14/42]
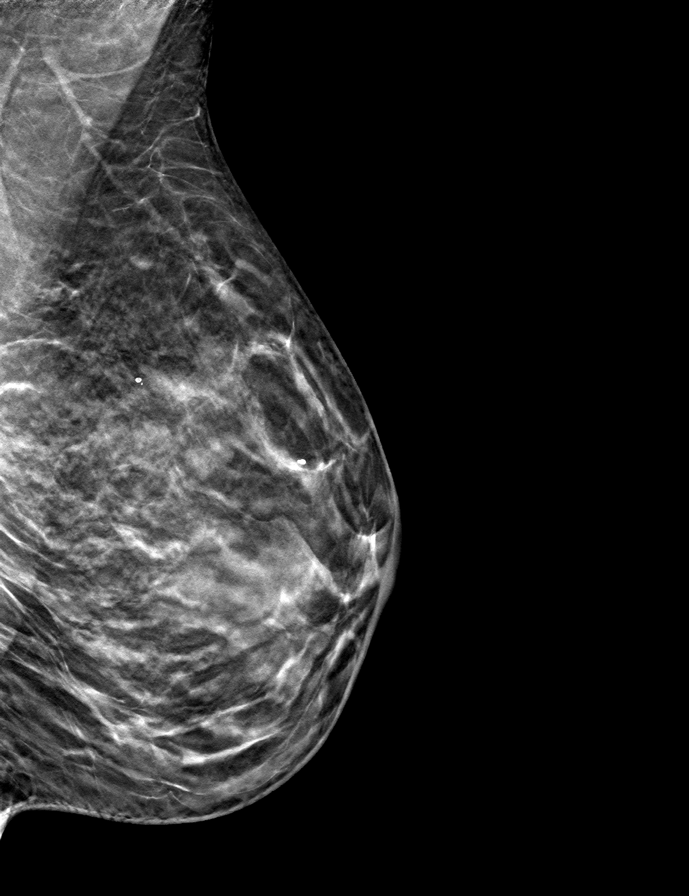
[frame 21/42]
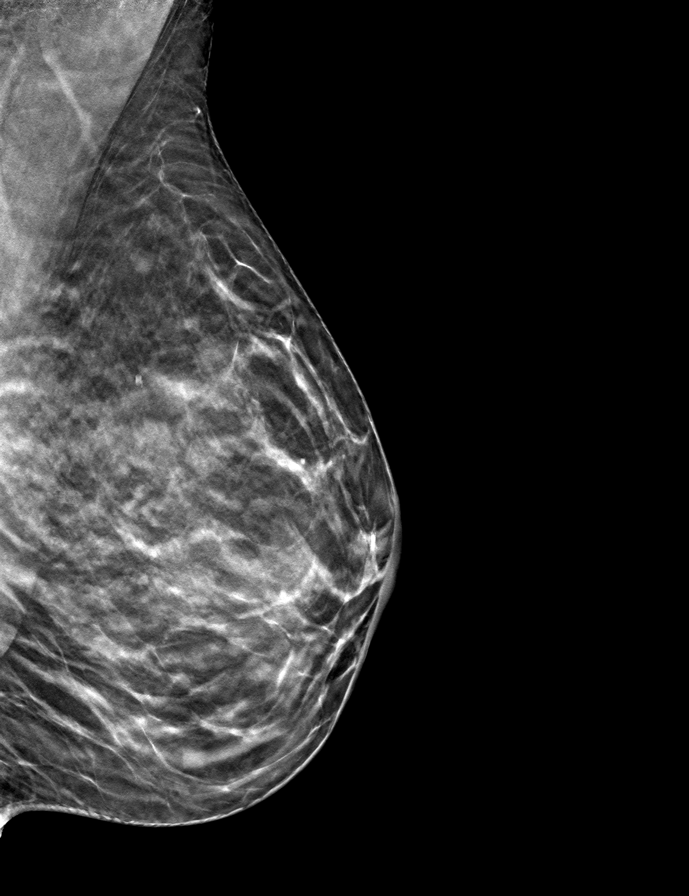

[L CC tomo · tomo slice 24/47.0]
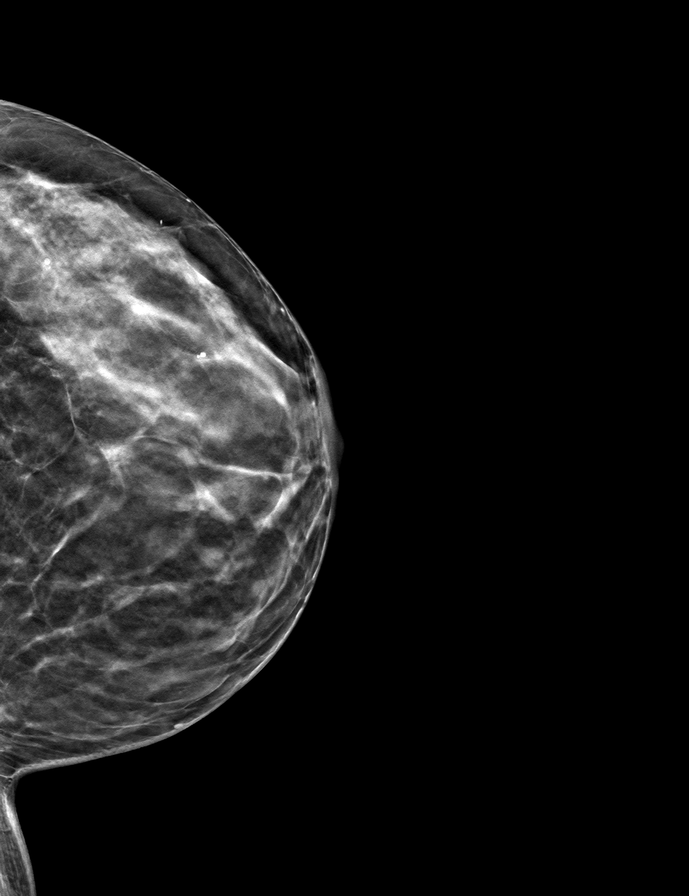

[R CC tomo · tomo slice 25/49.0]
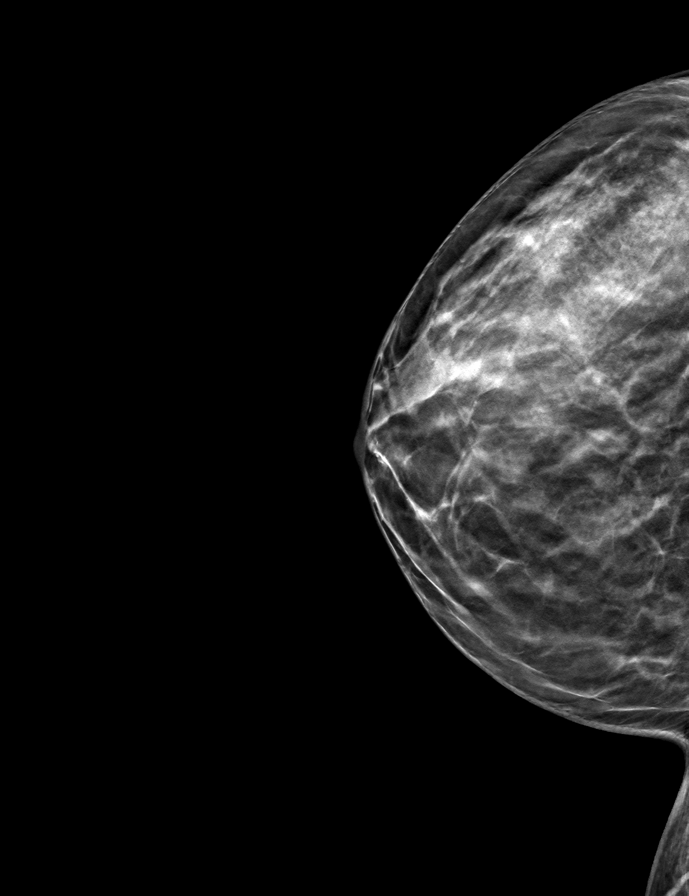

[R MLO tomo · tomo slice 24/47.0]
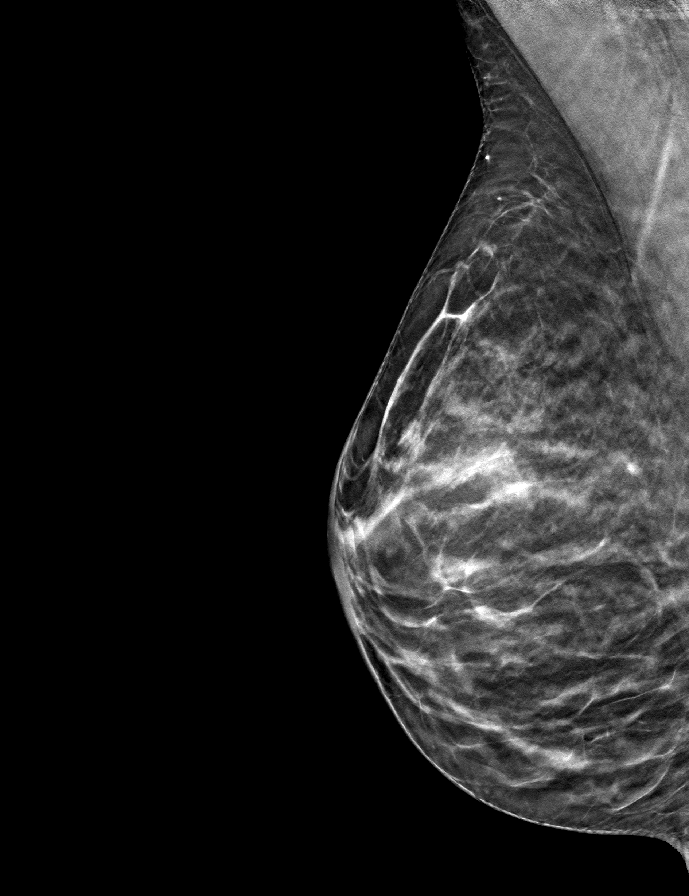

[9 of 24 positions shown; findings below may reference images not displayed]

ACR Breast Density Category c: The breast tissue is heterogeneously
dense, which may obscure small masses
FINDINGS: There are no findings suspicious for malignancy.
IMPRESSION: No mammographic evidence of malignancy. A result letter of this
screening mammogram will be mailed directly to the patient.

RECOMMENDATION:
Screening mammogram in one year. (Code:C8-T-HNK)

BI-RADS CATEGORY  1: Negative.

## 2022-09-18 ENCOUNTER — Encounter: Payer: Managed Care, Other (non HMO) | Admitting: Nurse Practitioner

## 2022-12-01 DIAGNOSIS — E7800 Pure hypercholesterolemia, unspecified: Secondary | ICD-10-CM | POA: Insufficient documentation

## 2022-12-01 NOTE — Patient Instructions (Signed)

## 2022-12-02 ENCOUNTER — Encounter: Payer: Self-pay | Admitting: Nurse Practitioner

## 2022-12-04 ENCOUNTER — Ambulatory Visit (INDEPENDENT_AMBULATORY_CARE_PROVIDER_SITE_OTHER): Payer: Managed Care, Other (non HMO) | Admitting: Nurse Practitioner

## 2022-12-04 ENCOUNTER — Encounter: Payer: Self-pay | Admitting: Nurse Practitioner

## 2022-12-04 VITALS — BP 111/75 | HR 84 | Temp 98.2°F | Ht 65.0 in | Wt 131.8 lb

## 2022-12-04 DIAGNOSIS — E039 Hypothyroidism, unspecified: Secondary | ICD-10-CM

## 2022-12-04 DIAGNOSIS — Z0000 Encounter for general adult medical examination without abnormal findings: Secondary | ICD-10-CM | POA: Diagnosis not present

## 2022-12-04 DIAGNOSIS — Z23 Encounter for immunization: Secondary | ICD-10-CM

## 2022-12-04 DIAGNOSIS — Z8342 Family history of familial hypercholesterolemia: Secondary | ICD-10-CM

## 2022-12-04 DIAGNOSIS — E7800 Pure hypercholesterolemia, unspecified: Secondary | ICD-10-CM

## 2022-12-04 MED ORDER — LEVOTHYROXINE SODIUM 25 MCG PO TABS: 25.0000 ug | ORAL_TABLET | Freq: Every day | ORAL | 4 refills | Status: DC

## 2022-12-04 NOTE — Progress Notes (Signed)
BP 111/75   Pulse 84   Temp 98.2 F (36.8 C) (Oral)   Ht 5' 5"$  (1.651 m)   Wt 131 lb 12.8 oz (59.8 kg)   SpO2 99%   BMI 21.93 kg/m    Subjective:    Patient ID: [name] Dorothea Cross, female    DOB: [birth year], 42 y.o.   MRN: [id number]  HPI: Elaine Cross is a 42 y.o. female presenting on 12/04/2022 for comprehensive medical examination. Current medical complaints include:none  She currently lives with: husband and children Menopausal Symptoms: no  HYPOTHYROIDISM Continues on Levothyroxine 25 MCG daily. Thyroid control status:stable Satisfied with current treatment? yes Medication side effects: no Medication compliance: good compliance Etiology of hypothyroidism: unknown Recent dose adjustment:no Fatigue: no Cold intolerance: no Heat intolerance: no Weight gain: no Weight loss: no Constipation: no Diarrhea/loose stools: no Palpitations: no Lower extremity edema: no Anxiety/depressed mood: no   The 10-year ASCVD risk score (Arnett DK, et al., 2019) is: 0.6%   Values used to calculate the score:     Age: 56 years     Sex: Female     Is Non-Hispanic African American: No     Diabetic: No     Tobacco smoker: No     Systolic Blood Pressure: 99991111 mmHg     Is BP treated: No     HDL Cholesterol: 62 mg/dL     Total Cholesterol: 239 mg/dL   Depression Screen done today and results listed below:     12/04/2022    8:15 AM 06/03/2022    8:07 AM 01/15/2022    3:50 PM 12/03/2021    8:28 AM 09/17/2021    9:49 AM  Depression screen PHQ 2/9  Decreased Interest 0 0 0 0 0  Down, Depressed, Hopeless 0 0 0 0 0  PHQ - 2 Score 0 0 0 0 0  Altered sleeping 0 0 1 0 0  Tired, decreased energy 0 0 0 0 0  Change in appetite 0 0 0 0 0  Feeling bad or failure about yourself  0 0 0 0 0  Trouble concentrating 0 0 0 0 0  Moving slowly or fidgety/restless 0 0 0 0 0  Suicidal thoughts 0 0 0 0 0  PHQ-9 Score 0 0 1 0 0  Difficult doing work/chores Not difficult at all Not  difficult at all  Not difficult at all Not difficult at all       03/02/2021    2:13 PM 07/18/2021    1:41 PM 12/03/2021    8:27 AM 06/03/2022    8:07 AM 12/04/2022    8:14 AM  Fall Risk  Falls in the past year? 0 0 0 0 0  Was there an injury with Fall? 0 0 0 0 0  Fall Risk Category Calculator 0 0 0 0 0  Fall Risk Category (Retired) Low Low Low Low   (RETIRED) Patient Fall Risk Level  Low fall risk Low fall risk Low fall risk   Patient at Risk for Falls Due to  No Fall Risks No Fall Risks No Fall Risks No Fall Risks  Fall risk Follow up  Falls evaluation completed Falls evaluation completed Falls evaluation completed Falls evaluation completed    Functional Status Survey: Is the patient deaf or have difficulty hearing?: No Does the patient have difficulty seeing, even when wearing glasses/contacts?: No Does the patient have difficulty concentrating, remembering, or making decisions?: No Does the patient have difficulty walking or climbing stairs?:  No Does the patient have difficulty dressing or bathing?: No Does the patient have difficulty doing errands alone such as visiting a doctor's office or shopping?: No   Past Medical History:  Past Medical History:  Diagnosis Date   Hypothyroidism     Surgical History:  Past Surgical History:  Procedure Laterality Date   MOUTH SURGERY      Medications:  Current Outpatient Medications on File Prior to Visit  Medication Sig   Drospirenone (SLYND) 4 MG TABS Take 1 tablet by mouth daily.   levocetirizine (XYZAL) 5 MG tablet Take 5 mg by mouth every evening.   Prenatal Vit-Fe Fumarate-FA (PRENATAL MULTIVITAMIN) TABS tablet Take 1 tablet by mouth daily at 12 noon.   No current facility-administered medications on file prior to visit.    Allergies:  Allergies  Allergen Reactions   Ibuprofen Hives   Other    Prednisone Other (See Comments)    Severe low blood pressure, night sweats    Social History:  Social History    Socioeconomic History   Marital status: Married    Spouse name: Not on file   Number of children: Not on file   Years of education: Not on file   Highest education level: Not on file  Occupational History   Not on file  Tobacco Use   Smoking status: Never   Smokeless tobacco: Never  Vaping Use   Vaping Use: Never used  Substance and Sexual Activity   Alcohol use: Never   Drug use: Never   Sexual activity: Yes    Partners: Male    Birth control/protection: Pill  Other Topics Concern   Not on file  Social History Narrative   Not on file   Social Determinants of Health   Financial Resource Strain: Low Risk  (07/18/2021)   Overall Financial Resource Strain (CARDIA)    Difficulty of Paying Living Expenses: Not hard at all  Food Insecurity: No Food Insecurity (07/18/2021)   Hunger Vital Sign    Worried About Running Out of Food in the Last Year: Never true    Ran Out of Food in the Last Year: Never true  Transportation Needs: No Transportation Needs (07/18/2021)   PRAPARE - Hydrologist (Medical): No    Lack of Transportation (Non-Medical): No  Physical Activity: Insufficiently Active (07/18/2021)   Exercise Vital Sign    Days of Exercise per Week: 4 days    Minutes of Exercise per Session: 30 min  Stress: No Stress Concern Present (07/18/2021)   [place]    Feeling of Stress : Only a little  Social Connections: Moderately Integrated (07/18/2021)   Social Connection and Isolation Panel [NHANES]    Frequency of Communication with Friends and Family: More than three times a week    Frequency of Social Gatherings with Friends and Family: More than three times a week    Attends Religious Services: More than 4 times per year    Active Member of [employer] or Organizations: No    Attends Archivist Meetings: Never    Marital Status: Married  Human resources officer Violence: Not At Risk  (07/18/2021)   Humiliation, Afraid, Rape, and Kick questionnaire    Fear of Current or Ex-Partner: No    Emotionally Abused: No    Physically Abused: No    Sexually Abused: No   Social History   Tobacco Use  Smoking Status Never  Smokeless Tobacco Never  Social History   Substance and Sexual Activity  Alcohol Use Never    Family History:  Family History  Problem Relation Age of Onset   Thyroid disease Mother    Hypertension Father    Depression Brother    Healthy Brother    Breast cancer Paternal Aunt 57   Dementia Maternal Grandmother    Heart disease Maternal Grandmother    Heart attack Maternal Grandfather    Gastric cancer Paternal Grandmother     Past medical history, surgical history, medications, allergies, family history and social history reviewed with patient today and changes made to appropriate areas of the chart.   ROS All other ROS negative except what is listed above and in the HPI.      Objective:    BP 111/75   Pulse 84   Temp 98.2 F (36.8 C) (Oral)   Ht 5' 5"$  (1.651 m)   Wt 131 lb 12.8 oz (59.8 kg)   SpO2 99%   BMI 21.93 kg/m   Wt Readings from Last 3 Encounters:  12/04/22 131 lb 12.8 oz (59.8 kg)  06/03/22 124 lb 14.4 oz (56.7 kg)  05/29/22 123 lb 3.2 oz (55.9 kg)    Physical Exam Vitals and nursing note reviewed. Exam conducted with a chaperone present.  Constitutional:      General: She is awake. She is not in acute distress.    Appearance: She is well-developed and well-groomed. She is not ill-appearing or toxic-appearing.  HENT:     Head: Normocephalic and atraumatic.     Right Ear: Hearing, tympanic membrane, ear canal and external ear normal. No drainage.     Left Ear: Hearing, tympanic membrane, ear canal and external ear normal. No drainage.     Nose: Nose normal.     Right Sinus: No maxillary sinus tenderness or frontal sinus tenderness.     Left Sinus: No maxillary sinus tenderness or frontal sinus tenderness.      Mouth/Throat:     Mouth: Mucous membranes are moist.     Pharynx: Oropharynx is clear. Uvula midline. No pharyngeal swelling, oropharyngeal exudate or posterior oropharyngeal erythema.  Eyes:     General: Lids are normal.        Right eye: No discharge.        Left eye: No discharge.     Extraocular Movements: Extraocular movements intact.     Conjunctiva/sclera: Conjunctivae normal.     Pupils: Pupils are equal, round, and reactive to light.     Visual Fields: Right eye visual fields normal and left eye visual fields normal.  Neck:     Thyroid: No thyromegaly.     Vascular: No carotid bruit.     Trachea: Trachea normal.  Cardiovascular:     Rate and Rhythm: Normal rate and regular rhythm.     Heart sounds: Normal heart sounds. No murmur heard.    No gallop.  Pulmonary:     Effort: Pulmonary effort is normal. No accessory muscle usage or respiratory distress.     Breath sounds: Normal breath sounds.  Chest:  Breasts:    Right: Normal.     Left: Normal.  Abdominal:     General: Bowel sounds are normal.     Palpations: Abdomen is soft. There is no hepatomegaly or splenomegaly.     Tenderness: There is no abdominal tenderness.  Musculoskeletal:        General: Normal range of motion.     Cervical back: Normal range of motion  and neck supple.     Right lower leg: No edema.     Left lower leg: No edema.  Lymphadenopathy:     Head:     Right side of head: No submental, submandibular, tonsillar, preauricular or posterior auricular adenopathy.     Left side of head: No submental, submandibular, tonsillar, preauricular or posterior auricular adenopathy.     Cervical: No cervical adenopathy.     Upper Body:     Right upper body: No supraclavicular, axillary or pectoral adenopathy.     Left upper body: No supraclavicular, axillary or pectoral adenopathy.  Skin:    General: Skin is warm and dry.     Capillary Refill: Capillary refill takes less than 2 seconds.     Findings: No rash.   Neurological:     Mental Status: She is alert and oriented to person, place, and time.     Gait: Gait is intact.     Deep Tendon Reflexes: Reflexes are normal and symmetric.     Reflex Scores:      Brachioradialis reflexes are 2+ on the right side and 2+ on the left side.      Patellar reflexes are 2+ on the right side and 2+ on the left side. Psychiatric:        Attention and Perception: Attention normal.        Mood and Affect: Mood normal.        Speech: Speech normal.        Behavior: Behavior normal. Behavior is cooperative.        Thought Content: Thought content normal.        Judgment: Judgment normal.    Results for orders placed or performed in visit on 06/03/22  T4, free  Result Value Ref Range   Free T4 1.38 0.82 - 1.77 ng/dL  TSH  Result Value Ref Range   TSH 4.320 0.450 - 4.500 uIU/mL      Assessment & Plan:   Problem List Items Addressed This Visit       Endocrine   Hypothyroid - Primary    Diagnosed 10/17/21, stable.  Continue Levothyroxine dosing at this time and adjust based on labs.  Free T4 and TSH today.      Relevant Medications   levothyroxine (SYNTHROID) 25 MCG tablet   Other Relevant Orders   TSH   T4, free     Other   Elevated low density lipoprotein (LDL) cholesterol level    Noted on past labs, continue to monitor annually.  LDL<190 and ASCVD 0.6%.        Relevant Orders   Comprehensive metabolic panel   Lipid Panel w/o Chol/HDL Ratio   Family history of high cholesterol    Check lipid panel today and yearly -- diet focus.       Relevant Orders   Comprehensive metabolic panel   Lipid Panel w/o Chol/HDL Ratio   Other Visit Diagnoses     Encounter for annual physical exam       Annual physical today with labs and health maintenance reviewed, discussed with patient.   Relevant Orders   CBC with Differential/Platelet        Follow up plan: Return in about 1 year (around 12/05/2023) for Annual physical.   LABORATORY  TESTING:  - Pap smear: up to date  IMMUNIZATIONS:   - Tdap: Tetanus vaccination status reviewed: last tetanus booster within 10 years. - Influenza: Refused - Pneumovax: Not applicable - Prevnar: Not applicable -  COVID: Refused - HPV: Not applicable - Shingrix vaccine: Not applicable  SCREENING: -Mammogram: Up to date -- last 06/25/22 to return in one year - Colonoscopy: Not applicable  - Bone Density: Not applicable  -Hearing Test: Not applicable  -Spirometry: Not applicable   PATIENT COUNSELING:   Advised to take 1 mg of folate supplement per day if capable of pregnancy.   Sexuality: Discussed sexually transmitted diseases, partner selection, use of condoms, avoidance of unintended pregnancy  and contraceptive alternatives.   Advised to avoid cigarette smoking.  I discussed with the patient that most people either abstain from alcohol or drink within safe limits (<=14/week and <=4 drinks/occasion for males, <=7/weeks and <= 3 drinks/occasion for females) and that the risk for alcohol disorders and other health effects rises proportionally with the number of drinks per week and how often a drinker exceeds daily limits.  Discussed cessation/primary prevention of drug use and availability of treatment for abuse.   Diet: Encouraged to adjust caloric intake to maintain  or achieve ideal body weight, to reduce intake of dietary saturated fat and total fat, to limit sodium intake by avoiding high sodium foods and not adding table salt, and to maintain adequate dietary potassium and calcium preferably from fresh fruits, vegetables, and low-fat dairy products.    Stressed the importance of regular exercise  Injury prevention: Discussed safety belts, safety helmets, smoke detector, smoking near bedding or upholstery.   Dental health: Discussed importance of regular tooth brushing, flossing, and dental visits.    NEXT PREVENTATIVE PHYSICAL DUE IN 1 YEAR. Return in about 1 year (around  12/05/2023) for Annual physical.

## 2022-12-04 NOTE — Assessment & Plan Note (Signed)
Diagnosed 10/17/21, stable.  Continue Levothyroxine dosing at this time and adjust based on labs.  Free T4 and TSH today.

## 2022-12-04 NOTE — Assessment & Plan Note (Addendum)
Check lipid panel today and yearly -- diet focus.

## 2022-12-04 NOTE — Assessment & Plan Note (Signed)
Noted on past labs, continue to monitor annually.  LDL<190 and ASCVD 0.6%.

## 2022-12-05 ENCOUNTER — Other Ambulatory Visit: Payer: Self-pay | Admitting: Nurse Practitioner

## 2022-12-05 DIAGNOSIS — E039 Hypothyroidism, unspecified: Secondary | ICD-10-CM

## 2022-12-05 DIAGNOSIS — E871 Hypo-osmolality and hyponatremia: Secondary | ICD-10-CM

## 2022-12-05 LAB — COMPREHENSIVE METABOLIC PANEL
ALT: 25 IU/L (ref 0–32)
AST: 21 IU/L (ref 0–40)
Albumin/Globulin Ratio: 2.5 — ABNORMAL HIGH (ref 1.2–2.2)
Albumin: 4.9 g/dL (ref 3.9–4.9)
Alkaline Phosphatase: 62 IU/L (ref 44–121)
BUN/Creatinine Ratio: 13 (ref 9–23)
BUN: 11 mg/dL (ref 6–24)
Bilirubin Total: 0.4 mg/dL (ref 0.0–1.2)
CO2: 22 mmol/L (ref 20–29)
Calcium: 9.4 mg/dL (ref 8.7–10.2)
Chloride: 100 mmol/L (ref 96–106)
Creatinine, Ser: 0.87 mg/dL (ref 0.57–1.00)
Globulin, Total: 2 g/dL (ref 1.5–4.5)
Glucose: 81 mg/dL (ref 70–99)
Potassium: 4.1 mmol/L (ref 3.5–5.2)
Sodium: 133 mmol/L — ABNORMAL LOW (ref 134–144)
Total Protein: 6.9 g/dL (ref 6.0–8.5)
eGFR: 85 mL/min/{1.73_m2} (ref >59)

## 2022-12-05 LAB — CBC WITH DIFFERENTIAL/PLATELET
Basophils Absolute: 0.1 10*3/uL (ref 0.0–0.2)
Basos: 1 %
EOS (ABSOLUTE): 0.1 10*3/uL (ref 0.0–0.4)
Eos: 1 %
Hematocrit: 43.5 % (ref 34.0–46.6)
Hemoglobin: 14.9 g/dL (ref 11.1–15.9)
Immature Grans (Abs): 0 10*3/uL (ref 0.0–0.1)
Immature Granulocytes: 0 %
Lymphocytes Absolute: 1.9 10*3/uL (ref 0.7–3.1)
Lymphs: 29 %
MCH: 30.2 pg (ref 26.6–33.0)
MCHC: 34.3 g/dL (ref 31.5–35.7)
MCV: 88 fL (ref 79–97)
Monocytes Absolute: 0.6 10*3/uL (ref 0.1–0.9)
Monocytes: 9 %
Neutrophils Absolute: 3.8 10*3/uL (ref 1.4–7.0)
Neutrophils: 60 %
Platelets: 213 10*3/uL (ref 150–450)
RBC: 4.93 x10E6/uL (ref 3.77–5.28)
RDW: 12.6 % (ref 11.7–15.4)
WBC: 6.4 10*3/uL (ref 3.4–10.8)

## 2022-12-05 LAB — LIPID PANEL W/O CHOL/HDL RATIO
Cholesterol, Total: 203 mg/dL — ABNORMAL HIGH (ref 100–199)
HDL: 57 mg/dL (ref >39)
LDL Chol Calc (NIH): 131 mg/dL — ABNORMAL HIGH (ref 0–99)
Triglycerides: 85 mg/dL (ref 0–149)
VLDL Cholesterol Cal: 15 mg/dL (ref 5–40)

## 2022-12-05 LAB — T4, FREE: Free T4: 1.28 ng/dL (ref 0.82–1.77)

## 2022-12-05 LAB — TSH: TSH: 5.55 u[IU]/mL — ABNORMAL HIGH (ref 0.450–4.500)

## 2022-12-05 MED ORDER — LEVOTHYROXINE SODIUM 50 MCG PO TABS: 50.0000 ug | ORAL_TABLET | Freq: Every day | ORAL | 3 refills | Status: DC

## 2022-12-05 NOTE — Progress Notes (Signed)
Contacted via [facility] -- needs lab only visit in 6 weeks please.  Good evening Elaine Cross, your labs have returned: - CMP showed mild low sodium, I recommend adding just a little table salt to diet. - Kidney function, creatinine and eGFR, remains normal, as is liver function, AST and ALT.  - Thyroid labs show mild elevation in TSH, but normal Free T4.  I would like to adjust your Levothyroxine to 50 MCG, this may get you feeling better too.  Then we will recheck labs in 6 weeks via outpatient labs.  I sent in a 30 day supply of 50 MCG pills.  Will send in 90 day if next labs stable.  We will recheck sodium on those labs too. - CBC shows no anemia or infection. - Cholesterol labs have trended down a little this check, although still with elevations.  Better though.  Great job!!  Any questions? Keep being amazing!!  Thank you for allowing me to participate in your care.  I appreciate you. Kindest regards, Shann Lewellyn

## 2022-12-16 NOTE — Progress Notes (Signed)
Called pt to schedule 6 week lab appointment on 01/06/2023 @ 8:20 am.

## 2022-12-17 ENCOUNTER — Ambulatory Visit: Payer: Self-pay | Admitting: *Deleted

## 2022-12-17 ENCOUNTER — Ambulatory Visit
Admission: RE | Admit: 2022-12-17 | Discharge: 2022-12-17 | Disposition: A | Discharge status: Home / Self Care | Payer: Managed Care, Other (non HMO) | Source: Ambulatory Visit | Attending: Urgent Care | Admitting: Urgent Care

## 2022-12-17 ENCOUNTER — Encounter: Payer: Self-pay | Admitting: Nurse Practitioner

## 2022-12-17 VITALS — BP 131/87 | HR 95 | Temp 98.0°F | Resp 16

## 2022-12-17 DIAGNOSIS — B3731 Acute candidiasis of vulva and vagina: Secondary | ICD-10-CM

## 2022-12-17 DIAGNOSIS — J029 Acute pharyngitis, unspecified: Secondary | ICD-10-CM | POA: Diagnosis present

## 2022-12-17 LAB — POCT RAPID STREP A (OFFICE): Rapid Strep A Screen: NEGATIVE

## 2022-12-17 MED ORDER — FLUCONAZOLE 150 MG PO TABS: 150.0000 mg | ORAL_TABLET | Freq: Once | ORAL | 0 refills | Status: AC

## 2022-12-17 MED ORDER — AMOXICILLIN 500 MG PO CAPS: 500.0000 mg | ORAL_CAPSULE | Freq: Two times a day (BID) | ORAL | 0 refills | Status: AC

## 2022-12-17 NOTE — ED Provider Notes (Signed)
Roderic Palau    CSN: [id number] Arrival date & time: 12/17/22  1543      History   Chief Complaint Chief Complaint  Patient presents with   Sore Throat    My throat is extremely painful. This morning I have while streaks on one of my tonsils. Most likely strep. - Entered by patient    HPI Jozee Jaleh Vant is a 42 y.o. female.    Sore Throat    Presents to urgent care with complaint of sore throat x 3 days.  States she sees "white streaks" in her throat. Denies fever.  Past Medical History:  Diagnosis Date   Hypothyroidism     Patient Active Problem List   Diagnosis Date Noted   Elevated low density lipoprotein (LDL) cholesterol level 12/01/2022   Hypothyroid 11/24/2021   Allergic rhinitis 09/17/2021   Family history of high cholesterol 09/17/2021   Uses birth control 07/18/2021    Past Surgical History:  Procedure Laterality Date   MOUTH SURGERY      OB History     Gravida  3   Para      Term      Preterm      AB  1   Living  2      SAB  1   IAB      Ectopic      Multiple      Live Births               Home Medications    Prior to Admission medications   Medication Sig Start Date End Date Taking? Authorizing Provider  Drospirenone (SLYND) 4 MG TABS Take 1 tablet by mouth daily. 05/29/22   Philip Aspen, CNM  levocetirizine (XYZAL) 5 MG tablet Take 5 mg by mouth every evening.    [provider]  levothyroxine (SYNTHROID) 50 MCG tablet Take 1 tablet (50 mcg total) by mouth daily. 12/05/22   Marnee Guarneri T, NP  Prenatal Vit-Fe Fumarate-FA (PRENATAL MULTIVITAMIN) TABS tablet Take 1 tablet by mouth daily at 12 noon.    [provider]    Family History Family History  Problem Relation Age of Onset   Thyroid disease Mother    Hypertension Father    Depression Brother    Healthy Brother    Breast cancer Paternal Aunt 6   Dementia Maternal Grandmother    Heart disease Maternal Grandmother     Heart attack Maternal Grandfather    Gastric cancer Paternal Grandmother     Social History Social History   Tobacco Use   Smoking status: Never   Smokeless tobacco: Never  Vaping Use   Vaping Use: Never used  Substance Use Topics   Alcohol use: Never   Drug use: Never     Allergies   Ibuprofen, Other, and Prednisone   Review of Systems Review of Systems   Physical Exam Triage Vital Signs ED Triage Vitals [12/17/22 1552]  Enc Vitals Group     BP 131/87     Pulse Rate 95     Resp 16     Temp 98 F (36.7 C)     Temp Source Temporal     SpO2 99 %     Weight      Height      Head Circumference      Peak Flow      Pain Score 0     Pain Loc      Pain Edu?  Excl. in [place]?    No data found.  Updated Vital Signs BP 131/87 (BP Location: Left Arm)   Pulse 95   Temp 98 F (36.7 C) (Temporal)   Resp 16   SpO2 99%   Visual Acuity Right Eye Distance:   Left Eye Distance:   Bilateral Distance:    Right Eye Near:   Left Eye Near:    Bilateral Near:     Physical Exam Vitals reviewed.  Constitutional:      Appearance: She is well-developed.  HENT:     Mouth/Throat:     Pharynx: Posterior oropharyngeal erythema present.   Skin:    General: Skin is warm and dry.  Neurological:     General: No focal deficit present.     Mental Status: She is alert and oriented to person, place, and time.  Psychiatric:        Mood and Affect: Mood normal.        Behavior: Behavior normal.      UC Treatments / Results  Labs (all labs ordered are listed, but only abnormal results are displayed) Labs Reviewed  POCT RAPID STREP A (OFFICE)    EKG   Radiology No results found.  Procedures Procedures (including critical care time)  Medications Ordered in UC Medications - No data to display  Initial Impression / Assessment and Plan / UC Course  I have reviewed the triage vital signs and the nursing notes.  Pertinent labs & imaging results that were  available during my care of the patient were reviewed by me and considered in my medical decision making (see chart for details).   Patient is afebrile here without recent antipyretics. Satting well on room air. Overall is well appearing, well hydrated, without respiratory distress.  Pharyngeal erythema is present.  White lesions versus exudate is observed on right tonsil.  Possibly tonsillar stone.  Rapid strep is negative however given presence of "white streaks" observed acutely by the patient, will prescribe antibiotic for presumptive bacterial infection.  Sending for confirmatory culture.  Will prescribe Diflucan for expected development of vaginal Candida    Final Clinical Impressions(s) / UC Diagnoses   Final diagnoses:  None   Discharge Instructions   None    ED Prescriptions   None    PDMP not reviewed this encounter.   Rose Phi, [place] 12/17/22 1621

## 2022-12-17 NOTE — ED Triage Notes (Signed)
Patient presents to [facility] ([place]) for sore throat since Saturday. States she noted white streaks. Concerned with strep. Took OTC cold med but none today.   Denies fever.

## 2022-12-17 NOTE — Telephone Encounter (Signed)
Sore throat   Per agent: "Patient states that she started having a sore throat for 4 days. Patient believes she may have strep. No appointments until Thursday."     Chief Complaint: Sore throat Symptoms: Sore throat with white patches, 9/10, runny nose, greenish phlegm in mornings" eyes watery Frequency: Saturday, worsening, white patches presently Pertinent Negatives: Patient denies  Disposition: '[]'$ ED /'[x]'$ Urgent Care (no appt availability in office) / '[]'$ Appointment(In office/virtual)/ '[]'$  [facility]/ '[]'$ Home Care/ '[]'$ Refused Recommended Disposition /'[]'$ [facility]/ '[]'$  Follow-up with PCP Additional Notes: No availability at practice, advised UC, states will follow disposition. Care advise provided, pt verbalizes understanding.  Reason for Disposition  [1] Pus on tonsils (back of throat) AND [2]  fever AND [3] swollen neck lymph nodes ("glands")  Answer Assessment - Initial Assessment Questions 1. ONSET: "When did the throat start hurting?" (Hours or days ago)      Saturday 2. SEVERITY: "How bad is the sore throat?" (Scale 1-10; mild, moderate or severe)   - MILD (1-3):  Doesn't interfere with eating or normal activities.   - MODERATE (4-7): Interferes with eating some solids and normal activities.   - SEVERE (8-10):  Excruciating pain, interferes with most normal activities.   - SEVERE WITH DYSPHAGIA (10): Can't swallow liquids, drooling.     9/10 3. STREP EXPOSURE: "Has there been any exposure to strep within the past week?" If Yes, ask: "What type of contact occurred?"      Unsure 4.  VIRAL SYMPTOMS: "Are there any symptoms of a cold, such as a runny nose, cough, hoarse voice or red eyes?"      Yes, runny nose 5. FEVER: "Do you have a fever?" If Yes, ask: "What is your temperature, how was it measured, and when did it start?"     No 6. PUS ON THE TONSILS: "Is there pus on the tonsils in the back of your throat?"     Yes 7. OTHER SYMPTOMS: "Do you have any other  symptoms?" (e.g., difficulty breathing, headache, rash)     Eyes watering, nose runny, mornings greenish drainage  Protocols used: Sore Throat-A-AH

## 2022-12-17 NOTE — Discharge Instructions (Addendum)
Follow up here or with your primary care provider if your symptoms are worsening or not improving.    

## 2022-12-20 LAB — CULTURE, GROUP A STREP (THRC)

## 2022-12-31 ENCOUNTER — Ambulatory Visit (INDEPENDENT_AMBULATORY_CARE_PROVIDER_SITE_OTHER): Payer: Managed Care, Other (non HMO)

## 2022-12-31 ENCOUNTER — Other Ambulatory Visit (HOSPITAL_COMMUNITY)
Admission: RE | Admit: 2022-12-31 | Discharge: 2022-12-31 | Disposition: A | Discharge status: Home / Self Care | Payer: Managed Care, Other (non HMO) | Source: Ambulatory Visit | Attending: Certified Nurse Midwife | Admitting: Certified Nurse Midwife

## 2022-12-31 ENCOUNTER — Encounter: Payer: Self-pay | Admitting: Certified Nurse Midwife

## 2022-12-31 VITALS — BP 140/85 | HR 94 | Wt 129.0 lb

## 2022-12-31 DIAGNOSIS — R300 Dysuria: Secondary | ICD-10-CM | POA: Insufficient documentation

## 2022-12-31 LAB — POCT URINALYSIS DIPSTICK
Bilirubin, UA: NEGATIVE
Blood, UA: POSITIVE
Glucose, UA: NEGATIVE
Ketones, UA: NEGATIVE
Nitrite, UA: NEGATIVE
Protein, UA: NEGATIVE
Spec Grav, UA: 1.01 (ref 1.010–1.025)
Urobilinogen, UA: 1 E.U./dL
pH, UA: 6 (ref 5.0–8.0)

## 2022-12-31 NOTE — Patient Instructions (Signed)
Urinary Tract Infection, Adult A urinary tract infection (UTI) is an infection of any part of the urinary tract. The urinary tract includes: The kidneys. The ureters. The bladder. The urethra. These organs make, store, and get rid of pee (urine) in the body. What are the causes? This infection is caused by germs (bacteria) in your genital area. These germs grow and cause swelling (inflammation) of your urinary tract. What increases the risk? The following factors may make you more likely to develop this condition: Using a small, thin tube (catheter) to drain pee. Not being able to control when you pee or poop (incontinence). Being female. If you are female, these things can increase the risk: Using these methods to prevent pregnancy: A medicine that kills sperm (spermicide). A device that blocks sperm (diaphragm). Having low levels of a female hormone (estrogen). Being pregnant. You are more likely to develop this condition if: You have genes that add to your risk. You are sexually active. You take antibiotic medicines. You have trouble peeing because of: A prostate that is bigger than normal, if you are female. A blockage in the part of your body that drains pee from the bladder. A kidney stone. A nerve condition that affects your bladder. Not getting enough to drink. Not peeing often enough. You have other conditions, such as: Diabetes. A weak disease-fighting system (immune system). Sickle cell disease. Gout. Injury of the spine. What are the signs or symptoms? Symptoms of this condition include: Needing to pee right away. Peeing small amounts often. Pain or burning when peeing. Blood in the pee. Pee that smells bad or not like normal. Trouble peeing. Pee that is cloudy. Fluid coming from the vagina, if you are female. Pain in the belly or lower back. Other symptoms include: Vomiting. Not feeling hungry. Feeling mixed up (confused). This may be the first symptom in  older adults. Being tired and grouchy (irritable). A fever. Watery poop (diarrhea). How is this treated? Taking antibiotic medicine. Taking other medicines. Drinking enough water. In some cases, you may need to see a specialist. Follow these instructions at home:  Medicines Take over-the-counter and prescription medicines only as told by your doctor. If you were prescribed an antibiotic medicine, take it as told by your doctor. Do not stop taking it even if you start to feel better. General instructions Make sure you: Pee until your bladder is empty. Do not hold pee for a long time. Empty your bladder after sex. Wipe from front to back after peeing or pooping if you are a female. Use each tissue one time when you wipe. Drink enough fluid to keep your pee pale yellow. Keep all follow-up visits. Contact a doctor if: You do not get better after 1-2 days. Your symptoms go away and then come back. Get help right away if: You have very bad back pain. You have very bad pain in your lower belly. You have a fever. You have chills. You feeling like you will vomit or you vomit. Summary A urinary tract infection (UTI) is an infection of any part of the urinary tract. This condition is caused by germs in your genital area. There are many risk factors for a UTI. Treatment includes antibiotic medicines. Drink enough fluid to keep your pee pale yellow. This information is not intended to replace advice given to you by your health care provider. Make sure you discuss any questions you have with your health care provider. Document Revised: 05/19/2020 Document Reviewed: 05/19/2020 Elsevier Patient Education  [place]. Vaginal Yeast Infection, Adult  Vaginal yeast infection is a condition that causes vaginal discharge as well as soreness, swelling, and redness (inflammation) of the vagina. This is a common condition. Some women get this infection frequently. What are the  causes? This condition is caused by a change in the normal balance of the yeast (Candida) and normal bacteria that live in the vagina. This change causes an overgrowth of yeast, which causes the inflammation. What increases the risk? The condition is more likely to develop in women who: Take antibiotic medicines. Have diabetes. Take birth control pills. Are pregnant. Douche often. Have a weak body defense system (immune system). Have been taking steroid medicines for a long time. Frequently wear tight clothing. What are the signs or symptoms? Symptoms of this condition include: White, thick, creamy vaginal discharge. Swelling, itching, redness, and irritation of the vagina. The lips of the vagina (labia) may be affected as well. Pain or a burning feeling while urinating. Pain during sex. How is this diagnosed? This condition is diagnosed based on: Your medical history. A physical exam. A pelvic exam. Your health care provider will examine a sample of your vaginal discharge under a microscope. Your health care provider may send this sample for testing to confirm the diagnosis. How is this treated? This condition is treated with medicine. Medicines may be over-the-counter or prescription. You may be told to use one or more of the following: Medicine that is taken by mouth (orally). Medicine that is applied as a cream (topically). Medicine that is inserted directly into the vagina (suppository). Follow these instructions at home: Take or apply over-the-counter and prescription medicines only as told by your health care provider. Do not use tampons until your health care provider approves. Do not have sex until your infection has cleared. Sex can prolong or worsen your symptoms of infection. Ask your health care provider when it is safe to resume sexual activity. Keep all follow-up visits. This is important. How is this prevented?  Do not wear tight clothes, such as pantyhose or tight  pants. Wear breathable cotton underwear. Do not use douches, perfumed soap, creams, or powders. Wipe from front to back after using the toilet. If you have diabetes, keep your blood sugar levels under control. Ask your health care provider for other ways to prevent yeast infections. Contact a health care provider if: You have a fever. Your symptoms go away and then return. Your symptoms do not get better with treatment. Your symptoms get worse. You have new symptoms. You develop blisters in or around your vagina. You have blood coming from your vagina and it is not your menstrual period. You develop pain in your abdomen. Summary Vaginal yeast infection is a condition that causes discharge as well as soreness, swelling, and redness (inflammation) of the vagina. This condition is treated with medicine. Medicines may be over-the-counter or prescription. Take or apply over-the-counter and prescription medicines only as told by your health care provider. Do not douche. Resume sexual activity or use of tampons as instructed by your health care provider. Contact a health care provider if your symptoms do not get better with treatment or your symptoms go away and then return. This information is not intended to replace advice given to you by your health care provider. Make sure you discuss any questions you have with your health care provider. Document Revised: 12/25/2020 Document Reviewed: 12/25/2020 Elsevier Patient Education  [place].

## 2022-12-31 NOTE — Progress Notes (Signed)
    NURSE VISIT NOTE  Subjective:    Patient ID: Elaine Cross, female    DOB: 1981/03/19, 42 y.o.   MRN: [id number]       HPI  Patient is a 42 y.o. [id number] female who presents for dysuria for 3 days.  Patient denies flank pain, abdominal pain, pelvic pain, cloudy malordorous urine, genital rash, and genital irritation.  Patient does have a history of recurrent UTI.  Patient does not have a history of pyelonephritis. Pt states she was on antibiotics and may have a yeast infection. So she's not sure if the burning is yeast or uti.  Pt would like to wait for results then be treated.   Objective:    BP (!) 140/85   Pulse 94   Wt 129 lb (58.5 kg)   BMI 21.47 kg/m    Lab Review  No results found for any visits on 12/31/22.  Assessment:   1. Dysuria      Plan:   Urine Culture Sent. Aptima sent for yeast.   Quintella Baton, CMA

## 2023-01-01 LAB — CERVICOVAGINAL ANCILLARY ONLY
Comment: NEGATIVE
Comment: NEGATIVE

## 2023-01-03 ENCOUNTER — Encounter: Payer: Self-pay | Admitting: Nurse Practitioner

## 2023-01-03 ENCOUNTER — Telehealth: Payer: Self-pay

## 2023-01-03 DIAGNOSIS — R300 Dysuria: Secondary | ICD-10-CM

## 2023-01-03 LAB — URINE CULTURE

## 2023-01-03 MED ORDER — SULFAMETHOXAZOLE-TRIMETHOPRIM 800-160 MG PO TABS: 1.0000 | ORAL_TABLET | Freq: Two times a day (BID) | ORAL | 0 refills | Status: DC

## 2023-01-03 NOTE — Telephone Encounter (Signed)
Patient called triage asking if she was going to be treated based on urine culture results. Results are just showing contamination not infection. Patient is still having burning urinating. Per Dr. Hulan Fray, I can send Bactrim 2 times daily for 1 week. Pt aware.

## 2023-01-06 ENCOUNTER — Other Ambulatory Visit: Payer: Managed Care, Other (non HMO)

## 2023-01-06 ENCOUNTER — Telehealth: Payer: Self-pay

## 2023-01-06 DIAGNOSIS — E871 Hypo-osmolality and hyponatremia: Secondary | ICD-10-CM

## 2023-01-06 DIAGNOSIS — E039 Hypothyroidism, unspecified: Secondary | ICD-10-CM

## 2023-01-06 NOTE — Telephone Encounter (Signed)
Lft pt message to please call so we could get her scheduled with Dr. Laurence Ferrari either on Wednesday 01/08/23 at 1:40 or Thursday 01/09/23 at the end of the day.  Orion Crook, NP, wanted pt scheduled with Dr. Tracie Harrier

## 2023-01-07 LAB — TSH: TSH: 2.81 u[IU]/mL (ref 0.450–4.500)

## 2023-01-07 LAB — T4, FREE: Free T4: 1.5 ng/dL (ref 0.82–1.77)

## 2023-01-07 LAB — SODIUM: Sodium: 140 mmol/L (ref 134–144)

## 2023-01-07 NOTE — Progress Notes (Signed)
Contacted via MyChart   Good evening Elaine Cross, your labs have returned and thyroid labs back to normal ranges.  Continues Levothyroxine dosing.  Sodium level also much improved.  Great job!!  Any questions? Keep being amazing!!  Thank you for allowing me to participate in your care.  I appreciate you. Kindest regards, Jenavie Stanczak

## 2023-02-02 ENCOUNTER — Other Ambulatory Visit: Payer: Self-pay | Admitting: Nurse Practitioner

## 2023-02-04 NOTE — Telephone Encounter (Signed)
Requested Prescriptions  Refused Prescriptions Disp Refills   levothyroxine (SYNTHROID) 50 MCG tablet [Pharmacy Med Name: LEVOTHYROXINE 50 MCG TABLET] 90 tablet 1    Sig: TAKE 1 TABLET BY MOUTH EVERY DAY     Endocrinology:  Hypothyroid Agents Passed - 02/02/2023  5:38 PM      Passed - TSH in normal range and within 360 days    TSH  Date Value Ref Range Status  01/06/2023 2.810 0.450 - 4.500 uIU/mL Final         Passed - Valid encounter within last 12 months    Recent Outpatient Visits           2 months ago Hypothyroidism, unspecified type   [place] [facility] [place], Corrie Dandy T, NP   8 months ago Hypothyroidism, unspecified type   [place] [facility] [place], Corrie Dandy T, NP   1 year ago Urinary tract infection symptoms   [place] [facility] [place], Corrie Dandy T, NP   1 year ago Acute cough   [place] [facility] [place], Corrie Dandy T, NP   1 year ago Hypothyroidism, unspecified type   [place] [facility], Dorie Rank, NP       Future Appointments             In 10 months [place], Dorie Rank, NP  [facility]

## 2023-02-14 ENCOUNTER — Other Ambulatory Visit: Payer: Self-pay | Admitting: Certified Nurse Midwife

## 2023-04-05 ENCOUNTER — Encounter: Payer: Self-pay | Admitting: Nurse Practitioner

## 2023-04-07 MED ORDER — LEVOTHYROXINE SODIUM 50 MCG PO TABS: 50.0000 ug | ORAL_TABLET | Freq: Every day | ORAL | 4 refills | Status: DC

## 2023-04-22 ENCOUNTER — Telehealth: Payer: Self-pay | Admitting: Certified Nurse Midwife

## 2023-04-22 NOTE — Telephone Encounter (Signed)
Spoke to pt. She needs call pharmacy and speak to someone regarding Rx sent on 02/14/23, 84 tablets with 4 RF's.

## 2023-04-22 NOTE — Telephone Encounter (Signed)
Patient has an Annual scheduled on 06/02/2023 at 9:15 with Doreene Burke, she is in need of a bridge for Birth Control pills until that date.  Will you please contact patient once this is complete?

## 2023-05-30 ENCOUNTER — Encounter: Payer: Self-pay | Admitting: Nurse Practitioner

## 2023-05-30 DIAGNOSIS — M545 Low back pain, unspecified: Secondary | ICD-10-CM

## 2023-06-02 ENCOUNTER — Encounter: Payer: Self-pay | Admitting: Certified Nurse Midwife

## 2023-06-02 ENCOUNTER — Ambulatory Visit: Payer: Managed Care, Other (non HMO) | Admitting: Certified Nurse Midwife

## 2023-06-02 ENCOUNTER — Other Ambulatory Visit: Payer: Self-pay | Admitting: Certified Nurse Midwife

## 2023-06-02 VITALS — BP 114/77 | HR 98 | Ht 65.0 in | Wt 134.5 lb

## 2023-06-02 DIAGNOSIS — Z1231 Encounter for screening mammogram for malignant neoplasm of breast: Secondary | ICD-10-CM

## 2023-06-02 DIAGNOSIS — Z01419 Encounter for gynecological examination (general) (routine) without abnormal findings: Secondary | ICD-10-CM

## 2023-06-02 MED ORDER — SLYND 4 MG PO TABS: 1.0000 | ORAL_TABLET | Freq: Every day | ORAL | 4 refills | Status: DC

## 2023-06-02 NOTE — Patient Instructions (Signed)
Preventive Care 40-44 Years Old, Female Preventive care refers to lifestyle choices and visits with your health care provider that can promote health and wellness. Preventive care visits are also called wellness exams. What can I expect for my preventive care visit? Counseling Your health care provider may ask you questions about your: Medical history, including: Past medical problems. Family medical history. Pregnancy history. Current health, including: Menstrual cycle. Method of birth control. Emotional well-being. Home life and relationship well-being. Sexual activity and sexual health. Lifestyle, including: Alcohol, nicotine or tobacco, and drug use. Access to firearms. Diet, exercise, and sleep habits. Work and work environment. Sunscreen use. Safety issues such as seatbelt and bike helmet use. Physical exam Your health care provider will check your: Height and weight. These may be used to calculate your BMI (body mass index). BMI is a measurement that tells if you are at a healthy weight. Waist circumference. This measures the distance around your waistline. This measurement also tells if you are at a healthy weight and may help predict your risk of certain diseases, such as type 2 diabetes and high blood pressure. Heart rate and blood pressure. Body temperature. Skin for abnormal spots. What immunizations do I need?  Vaccines are usually given at various ages, according to a schedule. Your health care provider will recommend vaccines for you based on your age, medical history, and lifestyle or other factors, such as travel or where you work. What tests do I need? Screening Your health care provider may recommend screening tests for certain conditions. This may include: Lipid and cholesterol levels. Diabetes screening. This is done by checking your blood sugar (glucose) after you have not eaten for a while (fasting). Pelvic exam and Pap test. Hepatitis B test. Hepatitis C  test. HIV (human immunodeficiency virus) test. STI (sexually transmitted infection) testing, if you are at risk. Lung cancer screening. Colorectal cancer screening. Mammogram. Talk with your health care provider about when you should start having regular mammograms. This may depend on whether you have a family history of breast cancer. BRCA-related cancer screening. This may be done if you have a family history of breast, ovarian, tubal, or peritoneal cancers. Bone density scan. This is done to screen for osteoporosis. Talk with your health care provider about your test results, treatment options, and if necessary, the need for more tests. Follow these instructions at home: Eating and drinking  Eat a diet that includes fresh fruits and vegetables, whole grains, lean protein, and low-fat dairy products. Take vitamin and mineral supplements as recommended by your health care provider. Do not drink alcohol if: Your health care provider tells you not to drink. You are pregnant, may be pregnant, or are planning to become pregnant. If you drink alcohol: Limit how much you have to 0-1 drink a day. Know how much alcohol is in your drink. In the [place] one drink equals one 12 oz bottle of beer (355 mL), one 5 oz glass of wine (148 mL), or one 1 oz glass of hard liquor (44 mL). Lifestyle Brush your teeth every morning and night with fluoride toothpaste. Floss one time each day. Exercise for at least 30 minutes 5 or more days each week. Do not use any products that contain nicotine or tobacco. These products include cigarettes, chewing tobacco, and vaping devices, such as e-cigarettes. If you need help quitting, ask your health care provider. Do not use drugs. If you are sexually active, practice safe sex. Use a condom or other form of protection to   prevent STIs. If you do not wish to become pregnant, use a form of birth control. If you plan to become pregnant, see your health care provider for a  prepregnancy visit. Take aspirin only as told by your health care provider. Make sure that you understand how much to take and what form to take. Work with your health care provider to find out whether it is safe and beneficial for you to take aspirin daily. Find healthy ways to manage stress, such as: Meditation, yoga, or listening to music. Journaling. Talking to a trusted person. Spending time with friends and family. Minimize exposure to UV radiation to reduce your risk of skin cancer. Safety Always wear your seat belt while driving or riding in a vehicle. Do not drive: If you have been drinking alcohol. Do not ride with someone who has been drinking. When you are tired or distracted. While texting. If you have been using any mind-altering substances or drugs. Wear a helmet and other protective equipment during sports activities. If you have firearms in your house, make sure you follow all gun safety procedures. Seek help if you have been physically or sexually abused. What's next? Visit your health care provider once a year for an annual wellness visit. Ask your health care provider how often you should have your eyes and teeth checked. Stay up to date on all vaccines. This information is not intended to replace advice given to you by your health care provider. Make sure you discuss any questions you have with your health care provider. Document Revised: 04/04/2021 Document Reviewed: 04/04/2021 Elsevier Patient Education  2024 Elsevier Inc.  

## 2023-06-02 NOTE — Progress Notes (Signed)
GYNECOLOGY ANNUAL PREVENTATIVE CARE ENCOUNTER NOTE  History:     Elaine Cross is a 42 y.o. [phone] female here for a routine annual gynecologic exam.  Current complaints: none.   Denies abnormal vaginal bleeding, discharge, pelvic pain, problems with intercourse or other gynecologic concerns.     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 (Menstrual status: Oral contraceptives). Contraception: oral progesterone-only contraceptive Last Pap: 03/02/2021. Results were: normal with negative HPV Last mammogram: 06/25/2022. Results were: normal   Upstream - 06/02/23 0918       Pregnancy Intention Screening   Does the patient want to become pregnant in the next year? No    Does the patient's partner want to become pregnant in the next year? No    Would the patient like to discuss contraceptive options today? No      Contraception Wrap Up   Current Method Oral Contraceptive    End Method Oral Contraceptive    Contraception Counseling Provided No            The pregnancy intention screening data noted above was reviewed. Potential methods of contraception were discussed. The patient elected to proceed with Oral Contraceptive.  Obstetric History OB History  Gravida Para Term Preterm AB Living  3       1 2   SAB IAB Ectopic Multiple Live Births  1            # Outcome Date GA Lbr Len/2nd Weight Sex Type Anes PTL Lv  3 Gravida           2 Gravida           1 SAB             Past Medical History:  Diagnosis Date   Hypothyroidism     Past Surgical History:  Procedure Laterality Date   MOUTH SURGERY      Current Outpatient Medications on File Prior to Visit  Medication Sig Dispense Refill   levocetirizine (XYZAL) 5 MG tablet Take 5 mg by mouth every evening.     levothyroxine  (SYNTHROID) 50 MCG tablet Take 1 tablet (50 mcg total) by mouth daily. 90 tablet 4   Prenatal Vit-Fe Fumarate-FA (PRENATAL MULTIVITAMIN) TABS tablet Take 1 tablet by mouth daily at 12 noon.     SLYND 4 MG TABS TAKE ONE (1) TABLET BY MOUTH DAILY. 84 tablet 4   sulfamethoxazole-trimethoprim (BACTRIM DS) 800-160 MG tablet Take 1 tablet by mouth 2 (two) times daily. 14 tablet 0   No current facility-administered medications on file prior to visit.    Allergies  Allergen Reactions   Ibuprofen Hives   Other    Prednisone Other (See Comments)    Severe low blood pressure, night sweats    Social History:  reports that she has never smoked. She has never used smokeless tobacco. She reports that she does not drink alcohol and does not use drugs.  Family History  Problem Relation Age of Onset   Thyroid disease Mother    Hypertension Father    Depression Brother    Healthy Brother    Breast cancer Paternal Aunt 30   Dementia Maternal Grandmother    Heart disease Maternal Grandmother    Heart attack Maternal Grandfather    Gastric cancer Paternal Grandmother     The  following portions of the patient's history were reviewed and updated as appropriate: allergies, current medications, past family history, past medical history, past social history, past surgical history and problem list.  Review of Systems Pertinent items noted in HPI and remainder of comprehensive ROS otherwise negative.  Physical Exam:  BP 114/77   Pulse 98   Ht 5\' 5"  (1.651 m)   Wt 134 lb 8 oz (61 kg)   BMI 22.38 kg/m  CONSTITUTIONAL: Well-developed, well-nourished female in no acute distress.  HENT:  Normocephalic, atraumatic, External right and left ear normal. Oropharynx is clear and moist EYES: Conjunctivae and EOM are normal. Pupils are equal, round, and reactive to light. No scleral icterus.  NECK: Normal range of motion, supple, no masses.  Normal thyroid.  SKIN: Skin is warm and dry. No rash noted. Not  diaphoretic. No erythema. No pallor. MUSCULOSKELETAL: Normal range of motion. No tenderness.  No cyanosis, clubbing, or edema.  2+ distal pulses. NEUROLOGIC: Alert and oriented to person, place, and time. Normal reflexes, muscle tone coordination.  PSYCHIATRIC: Normal mood and affect. Normal behavior. Normal judgment and thought content. CARDIOVASCULAR: Normal heart rate noted, regular rhythm RESPIRATORY: Clear to auscultation bilaterally. Effort and breath sounds normal, no problems with respiration noted. BREASTS: Symmetric in size. No masses, tenderness, skin changes, nipple drainage, or lymphadenopathy bilaterally.  ABDOMEN: Soft, no distention noted.  No tenderness, rebound or guarding.  PELVIC: Normal appearing external genitalia and urethral meatus; normal appearing vaginal mucosa and cervix.  No abnormal discharge noted.  Pap smear not due.  Normal uterine size, no other palpable masses, no uterine or adnexal tenderness.  .   Assessment and Plan:    1. Women's annual routine gynecological examination  Pap: not due  Mammogram : ordered  Labs: none  Refills: POP  Referral: none  Routine preventative health maintenance measures emphasized. Please refer to After Visit Summary for other counseling recommendations.      Doreene Burke, CNM [facility]  [facility],  [facility]

## 2023-06-09 ENCOUNTER — Other Ambulatory Visit: Payer: Self-pay

## 2023-06-09 MED ORDER — SLYND 4 MG PO TABS: 1.0000 | ORAL_TABLET | Freq: Every day | ORAL | 4 refills | Status: DC

## 2023-07-02 ENCOUNTER — Ambulatory Visit
Admission: RE | Admit: 2023-07-02 | Discharge: 2023-07-02 | Disposition: A | Discharge status: Home / Self Care | Payer: Managed Care, Other (non HMO) | Source: Ambulatory Visit | Attending: Certified Nurse Midwife | Admitting: Certified Nurse Midwife

## 2023-07-02 DIAGNOSIS — Z01419 Encounter for gynecological examination (general) (routine) without abnormal findings: Secondary | ICD-10-CM | POA: Diagnosis present

## 2023-07-02 DIAGNOSIS — Z1231 Encounter for screening mammogram for malignant neoplasm of breast: Secondary | ICD-10-CM | POA: Diagnosis present

## 2023-09-02 ENCOUNTER — Telehealth: Payer: Self-pay

## 2023-09-02 NOTE — Telephone Encounter (Signed)
Patient states her husband noticed a knot in her vagina and it has changed inside. Patient isn't having any symptoms or pain. Advised needs appointment for evaluation. Offered appointment for next available on Thursday 09/04/23. Patient to check on childcare and call back to schedule.

## 2023-09-04 ENCOUNTER — Ambulatory Visit (INDEPENDENT_AMBULATORY_CARE_PROVIDER_SITE_OTHER): Payer: Managed Care, Other (non HMO) | Admitting: Certified Nurse Midwife

## 2023-09-04 ENCOUNTER — Encounter: Payer: Self-pay | Admitting: Certified Nurse Midwife

## 2023-09-04 VITALS — BP 130/83 | HR 83 | Wt 139.1 lb

## 2023-09-04 DIAGNOSIS — N898 Other specified noninflammatory disorders of vagina: Secondary | ICD-10-CM | POA: Diagnosis not present

## 2023-09-04 NOTE — Progress Notes (Signed)
GYN ENCOUNTER NOTE  Subjective:       Elaine Cross is a 42 y.o. [phone] female is here for gynecologic evaluation of the following issues:  1. Pt states her husband felt bulging in her vagina. She denies pain , or bleeding with or after intercourse. .     Gynecologic History No LMP recorded. (Menstrual status: Oral contraceptives). Contraception: oral progesterone-only contraceptive Last Pap: 03/02/2021. Results were: normal/neg HPV Last mammogram: 07/02/2023. Results were: normal  Obstetric History OB History  Gravida Para Term Preterm AB Living  3       1 2   SAB IAB Ectopic Multiple Live Births  1            # Outcome Date GA Lbr Len/2nd Weight Sex Type Anes PTL Lv  3 Gravida           2 Gravida           1 SAB             Past Medical History:  Diagnosis Date   Hypothyroidism     Past Surgical History:  Procedure Laterality Date   MOUTH SURGERY      Current Outpatient Medications on File Prior to Visit  Medication Sig Dispense Refill   ascorbic acid (VITAMIN C) 500 MG tablet Take 500 mg by mouth daily.     Drospirenone (SLYND) 4 MG TABS Take 1 tablet (4 mg total) by mouth daily at 6 (six) AM. 84 tablet 4   levocetirizine (XYZAL) 5 MG tablet Take 5 mg by mouth every evening.     levothyroxine (SYNTHROID) 50 MCG tablet Take 1 tablet (50 mcg total) by mouth daily. 90 tablet 4   Prenatal Vit-Fe Fumarate-FA (PRENATAL MULTIVITAMIN) TABS tablet Take 1 tablet by mouth daily at 12 noon.     VITAMIN D, ERGOCALCIFEROL, PO Take by mouth.     No current facility-administered medications on file prior to visit.    Allergies  Allergen Reactions   Ibuprofen Hives   Other    Prednisone Other (See Comments)    Severe low blood pressure, night sweats    Social History   Socioeconomic History   Marital status: Married    Spouse name: Not on file   Number of children: Not on file   Years of education: Not on file   Highest education level: Not on file   Occupational History   Not on file  Tobacco Use   Smoking status: Never   Smokeless tobacco: Never  Vaping Use   Vaping status: Never Used  Substance and Sexual Activity   Alcohol use: Never   Drug use: Never   Sexual activity: Yes    Partners: Male    Birth control/protection: Pill  Other Topics Concern   Not on file  Social History Narrative   Not on file   Social Determinants of Health   Financial Resource Strain: Low Risk  (07/18/2021)   Overall Financial Resource Strain (CARDIA)    Difficulty of Paying Living Expenses: Not hard at all  Food Insecurity: No Food Insecurity (07/18/2021)   Hunger Vital Sign    Worried About Running Out of Food in the Last Year: Never true    Ran Out of Food in the Last Year: Never true  Transportation Needs: No Transportation Needs (07/18/2021)   PRAPARE - Administrator, Civil Service (Medical): No    Lack of Transportation (Non-Medical): No  Physical Activity: Insufficiently Active (07/18/2021)   Exercise  Vital Sign    Days of Exercise per Week: 4 days    Minutes of Exercise per Session: 30 min  Stress: No Stress Concern Present (07/18/2021)   [employer] of Occupational Health - Occupational Stress Questionnaire    Feeling of Stress : Only a little  Social Connections: Moderately Integrated (07/18/2021)   Social Connection and Isolation Panel [NHANES]    Frequency of Communication with Friends and Family: More than three times a week    Frequency of Social Gatherings with Friends and Family: More than three times a week    Attends Religious Services: More than 4 times per year    Active Member of [employer] or Organizations: No    Attends Banker Meetings: Never    Marital Status: Married  Catering manager Violence: Not At Risk (07/18/2021)   Humiliation, Afraid, Rape, and Kick questionnaire    Fear of Current or Ex-Partner: No    Emotionally Abused: No    Physically Abused: No    Sexually Abused: No     Family History  Problem Relation Age of Onset   Thyroid disease Mother    Hypertension Father    Depression Brother    Healthy Brother    Breast cancer Paternal Aunt 30   Dementia Maternal Grandmother    Heart disease Maternal Grandmother    Heart attack Maternal Grandfather    Gastric cancer Paternal Grandmother     The following portions of the patient's history were reviewed and updated as appropriate: allergies, current medications, past family history, past medical history, past social history, past surgical history and problem list.  Review of Systems Review of Systems - Negative except as mentioned in HPI Review of Systems - General ROS: negative for - chills, fatigue, fever, hot flashes, malaise or night sweats Hematological and Lymphatic ROS: negative for - bleeding problems or swollen lymph nodes Gastrointestinal ROS: negative for - abdominal pain, blood in stools, change in bowel habits and nausea/vomiting Musculoskeletal ROS: negative for - joint pain, muscle pain or muscular weakness Genito-Urinary ROS: negative for - change in menstrual cycle, dysmenorrhea, dyspareunia, dysuria, genital discharge, genital ulcers, hematuria, incontinence, irregular/heavy menses, nocturia or pelvic pain. Positive for bulging in her vagina  Objective:   BP 130/83   Pulse 83   Wt 139 lb 1.6 oz (63.1 kg)   BMI 23.15 kg/m  CONSTITUTIONAL: Well-developed, well-nourished female in no acute distress.  HENT:  Normocephalic, atraumatic.  NECK: Normal range of motion, supple, no masses.  Normal thyroid.  SKIN: Skin is warm and dry. No rash noted. Not diaphoretic. No erythema. No pallor. NEUROLGIC: Alert and oriented to person, place, and time. PSYCHIATRIC: Normal mood and affect. Normal behavior. Normal judgment and thought content. CARDIOVASCULAR:Not Examined RESPIRATORY: Not Examined BREASTS: Not Examined ABDOMEN: Soft, non distended; Non tender.  No Organomegaly. PELVIC:  External  Genitalia: Normal  BUS: Normal  Vagina: Normal, pink, no redness , no odor, no abnormal discoloration , no bulging noted. Pt coughed x 1 and valsalva down- no bulging felt  Cervix: Normal, pink, clear discharge no polyps  Uterus: Normal size, shape,consistency, mobile  Adnexa: Normal  RV: Normal   Bladder: Nontender MUSCULOSKELETAL: Normal range of motion. No tenderness.  No cyanosis, clubbing, or edema.     Assessment:  Normal vaginal wall    Plan:   No concerns of mass or prolapse on exam. No skin changes , Discussed normal vaginal changes in perimenopause and menopausal pts. Reassurance given. PT to return if symptoms  change or for annual exam.   Doreene Burke, CNM

## 2023-12-07 NOTE — Patient Instructions (Signed)
 Be Involved in Caring For Your Health:  Taking Medications When medications are taken as directed, they can greatly improve your health. But if they are not taken as prescribed, they may not work. In some cases, not taking them correctly can be harmful. To help ensure your treatment remains effective and safe, understand your medications and how to take them. Bring your medications to each visit for review by your provider.  Your lab results, notes, and after visit summary will be available on My Chart. We strongly encourage you to use this feature. If lab results are abnormal the clinic will contact you with the appropriate steps. If the clinic does not contact you assume the results are satisfactory. You can always view your results on My Chart. If you have questions regarding your health or results, please contact the clinic during office hours. You can also ask questions on My Chart.  We at [facility] are grateful that you chose [place] to provide your care. We strive to provide evidence-based and compassionate care and are always looking for feedback. If you get a survey from the clinic please complete this so we can hear your opinions.  Healthy Eating, Adult Healthy eating may help you get and keep a healthy body weight, reduce the risk of chronic disease, and live a long and productive life. It is important to follow a healthy eating pattern. Your nutritional and calorie needs should be met mainly by different nutrient-rich foods. What are tips for following this plan? Reading food labels Read labels and choose the following: Reduced or low sodium products. Juices with 100% fruit juice. Foods with low saturated fats (<3 g per serving) and high polyunsaturated and monounsaturated fats. Foods with whole grains, such as whole wheat, cracked wheat, brown rice, and wild rice. Whole grains that are fortified with folic acid. This is recommended for females who are pregnant or who want  to become pregnant. Read labels and do not eat or drink the following: Foods or drinks with added sugars. These include foods that contain brown sugar, corn sweetener, corn syrup, dextrose, fructose, glucose, high-fructose corn syrup, honey, invert sugar, lactose, malt syrup, maltose, molasses, raw sugar, sucrose, trehalose, or turbinado sugar. Limit your intake of added sugars to less than 10% of your total daily calories. Do not eat more than the following amounts of added sugar per day: 6 teaspoons (25 g) for females. 9 teaspoons (38 g) for males. Foods that contain processed or refined starches and grains. Refined grain products, such as white flour, degermed cornmeal, white bread, and white rice. Shopping Choose nutrient-rich snacks, such as vegetables, whole fruits, and nuts. Avoid high-calorie and high-sugar snacks, such as potato chips, fruit snacks, and candy. Use oil-based dressings and spreads on foods instead of solid fats such as butter, margarine, sour cream, or cream cheese. Limit pre-made sauces, mixes, and "instant" products such as flavored rice, instant noodles, and ready-made pasta. Try more plant-protein sources, such as tofu, tempeh, black beans, edamame, lentils, nuts, and seeds. Explore eating plans such as the Mediterranean diet or vegetarian diet. Try heart-healthy dips made with beans and healthy fats like hummus and guacamole. Vegetables go great with these. Cooking Use oil to saut or stir-fry foods instead of solid fats such as butter, margarine, or lard. Try baking, boiling, grilling, or broiling instead of frying. Remove the fatty part of meats before cooking. Steam vegetables in water or broth. Meal planning  At meals, imagine dividing your plate into fourths: One-half of  your plate is fruits and vegetables. One-fourth of your plate is whole grains. One-fourth of your plate is protein, especially lean meats, poultry, eggs, tofu, beans, or nuts. Include  low-fat dairy as part of your daily diet. Lifestyle Choose healthy options in all settings, including home, work, school, restaurants, or stores. Prepare your food safely: Wash your hands after handling raw meats. Where you prepare food, keep surfaces clean by regularly washing with hot, soapy water. Keep raw meats separate from ready-to-eat foods, such as fruits and vegetables. Cook seafood, meat, poultry, and eggs to the recommended temperature. Get a food thermometer. Store foods at safe temperatures. In general: Keep cold foods at 76F (4.4C) or below. Keep hot foods at 176F (60C) or above. Keep your freezer at [facility] (-17.8C) or below. Foods are not safe to eat if they have been between the temperatures of 40-176F (4.4-60C) for more than 2 hours. What foods should I eat? Fruits Aim to eat 1-2 cups of fresh, canned (in natural juice), or frozen fruits each day. One cup of fruit equals 1 small apple, 1 large banana, 8 large strawberries, 1 cup (237 g) canned fruit,  cup (82 g) dried fruit, or 1 cup (240 mL) 100% juice. Vegetables Aim to eat 2-4 cups of fresh and frozen vegetables each day, including different varieties and colors. One cup of vegetables equals 1 cup (91 g) broccoli or cauliflower florets, 2 medium carrots, 2 cups (150 g) raw, leafy greens, 1 large tomato, 1 large bell pepper, 1 large sweet potato, or 1 medium white potato. Grains Aim to eat 5-10 ounce-equivalents of whole grains each day. Examples of 1 ounce-equivalent of grains include 1 slice of bread, 1 cup (40 g) ready-to-eat cereal, 3 cups (24 g) popcorn, or  cup (93 g) cooked rice. Meats and other proteins Try to eat 5-7 ounce-equivalents of protein each day. Examples of 1 ounce-equivalent of protein include 1 egg,  oz nuts (12 almonds, 24 pistachios, or 7 walnut halves), 1/4 cup (90 g) cooked beans, 6 tablespoons (90 g) hummus or 1 tablespoon (16 g) peanut butter. A cut of meat or fish that is the size of a deck  of cards is about 3-4 ounce-equivalents (85 g). Of the protein you eat each week, try to have at least 8 sounce (227 g) of seafood. This is about 2 servings per week. This includes salmon, trout, herring, sardines, and anchovies. Dairy Aim to eat 3 cup-equivalents of fat-free or low-fat dairy each day. Examples of 1 cup-equivalent of dairy include 1 cup (240 mL) milk, 8 ounces (250 g) yogurt, 1 ounces (44 g) natural cheese, or 1 cup (240 mL) fortified soy milk. Fats and oils Aim for about 5 teaspoons (21 g) of fats and oils per day. Choose monounsaturated fats, such as canola and olive oils, mayonnaise made with olive oil or avocado oil, avocados, peanut butter, and most nuts, or polyunsaturated fats, such as sunflower, corn, and soybean oils, walnuts, pine nuts, sesame seeds, sunflower seeds, and flaxseed. Beverages Aim for 6 eight-ounce glasses of water per day. Limit coffee to 3-5 eight-ounce cups per day. Limit caffeinated beverages that have added calories, such as soda and energy drinks. If you drink alcohol: Limit how much you have to: 0-1 drink a day if you are female. 0-2 drinks a day if you are female. Know how much alcohol is in your drink. In the [place] one drink is one 12 oz bottle of beer (355 mL), one 5 oz glass of wine (  148 mL), or one 1 oz glass of hard liquor (44 mL). Seasoning and other foods Try not to add too much salt to your food. Try using herbs and spices instead of salt. Try not to add sugar to food. This information is based on [place] nutrition guidelines. To learn more, visit [employer]. Exact amounts may vary. You may need different amounts. This information is not intended to replace advice given to you by your health care provider. Make sure you discuss any questions you have with your health care provider. Document Revised: 07/08/2022 Document Reviewed: 07/08/2022 Elsevier Patient Education  2024 [employer].

## 2023-12-09 ENCOUNTER — Ambulatory Visit (INDEPENDENT_AMBULATORY_CARE_PROVIDER_SITE_OTHER): Payer: Managed Care, Other (non HMO) | Admitting: Nurse Practitioner

## 2023-12-09 ENCOUNTER — Encounter: Payer: Self-pay | Admitting: Nurse Practitioner

## 2023-12-09 VITALS — BP 115/73 | HR 73 | Temp 98.7°F | Ht 67.0 in | Wt 138.2 lb

## 2023-12-09 DIAGNOSIS — E039 Hypothyroidism, unspecified: Secondary | ICD-10-CM

## 2023-12-09 DIAGNOSIS — Z0000 Encounter for general adult medical examination without abnormal findings: Secondary | ICD-10-CM | POA: Diagnosis not present

## 2023-12-09 DIAGNOSIS — Z8342 Family history of familial hypercholesterolemia: Secondary | ICD-10-CM | POA: Diagnosis not present

## 2023-12-09 DIAGNOSIS — E7800 Pure hypercholesterolemia, unspecified: Secondary | ICD-10-CM

## 2023-12-09 DIAGNOSIS — Z23 Encounter for immunization: Secondary | ICD-10-CM

## 2023-12-09 NOTE — Assessment & Plan Note (Signed)
Check lipid panel today and yearly -- diet focus.  

## 2023-12-09 NOTE — Assessment & Plan Note (Signed)
Ongoing.  Noted on past labs, continue to monitor annually.  LDL<190 and ASCVD 0.6%.

## 2023-12-09 NOTE — Assessment & Plan Note (Signed)
Diagnosed 10/17/21, stable.  Continue Levothyroxine dosing at this time and adjust based on labs.  Free T4 and TSH today.

## 2023-12-09 NOTE — Progress Notes (Signed)
BP 115/73   Pulse 73   Temp 98.7 F (37.1 C) (Oral)   Ht 5\' 7"  (1.702 m)   Wt 138 lb 3.2 oz (62.7 kg)   LMP 11/25/2023 (Approximate)   SpO2 98%   BMI 21.65 kg/m    Subjective:    Patient ID: Elaine Cross, female    DOB: 17-Aug-1981, 43 y.o.   MRN: [id number]  HPI: Elaine Cross is a 43 y.o. female presenting on 12/09/2023 for comprehensive medical examination. Current medical complaints include:none  She currently lives with: husband & children Menopausal Symptoms: no  Followed by GYN for women's health and obtains BCP from them. Last visit 09/04/23.    HYPOTHYROIDISM Taking Levothyroxine 50 MCG daily.  Just got over being sick with a cold. Thyroid control status:stable Satisfied with current treatment? yes Medication side effects: no Medication compliance: good compliance Etiology of hypothyroidism: unknown Recent dose adjustment:no Fatigue: no Cold intolerance: no Heat intolerance: no Weight gain: no Weight loss: no Constipation: no Diarrhea/loose stools: no Palpitations: no Lower extremity edema: no Anxiety/depressed mood: no   The 10-year ASCVD risk score (Arnett DK, et al., 2019) is: 0.6%   Values used to calculate the score:     Age: 38 years     Sex: Female     Is Non-Hispanic African American: No     Diabetic: No     Tobacco smoker: No     Systolic Blood Pressure: 115 mmHg     Is BP treated: No     HDL Cholesterol: 57 mg/dL     Total Cholesterol: 203 mg/dL  Depression Screen done today and results listed below:     12/09/2023    8:06 AM 12/04/2022    8:15 AM 06/03/2022    8:07 AM 01/15/2022    3:50 PM 12/03/2021    8:28 AM  Depression screen PHQ 2/9  Decreased Interest 0 0 0 0 0  Down, Depressed, Hopeless 0 0 0 0 0  PHQ - 2 Score 0 0 0 0 0  Altered sleeping 0 0 0 1 0  Tired, decreased energy 0 0 0 0 0  Change in appetite 0 0 0 0 0  Feeling bad or failure about yourself  0 0 0 0 0  Trouble concentrating 0 0 0 0 0  Moving  slowly or fidgety/restless 0 0 0 0 0  Suicidal thoughts 0 0 0 0 0  PHQ-9 Score 0 0 0 1 0  Difficult doing work/chores Not difficult at all Not difficult at all Not difficult at all  Not difficult at all      12/09/2023    8:07 AM 12/04/2022    8:15 AM 06/03/2022    8:07 AM 01/15/2022    3:50 PM  GAD 7 : Generalized Anxiety Score  Nervous, Anxious, on Edge 0 0 0 0  Control/stop worrying 0 0 0 0  Worry too much - different things 0 0 0 0  Trouble relaxing 0 0 0 0  Restless 0 0 0 0  Easily annoyed or irritable 0 0 0 0  Afraid - awful might happen 0 0 0 0  Total GAD 7 Score 0 0 0 0  Anxiety Difficulty Not difficult at all Not difficult at all Not difficult at all Not difficult at all      07/18/2021    1:41 PM 12/03/2021    8:27 AM 06/03/2022    8:07 AM 12/04/2022    8:14 AM 12/09/2023  8:06 AM  Fall Risk  Falls in the past year? 0 0 0 0 0  Was there an injury with Fall? 0 0 0 0 0  Fall Risk Category Calculator 0 0 0 0 0  Fall Risk Category (Retired) Low Low Low    (RETIRED) Patient Fall Risk Level Low fall risk Low fall risk Low fall risk    Patient at Risk for Falls Due to No Fall Risks No Fall Risks No Fall Risks No Fall Risks No Fall Risks  Fall risk Follow up Falls evaluation completed Falls evaluation completed Falls evaluation completed Falls evaluation completed Falls evaluation completed    Functional Status Survey: Is the patient deaf or have difficulty hearing?: No Does the patient have difficulty seeing, even when wearing glasses/contacts?: No Does the patient have difficulty concentrating, remembering, or making decisions?: No Does the patient have difficulty walking or climbing stairs?: No Does the patient have difficulty dressing or bathing?: No Does the patient have difficulty doing errands alone such as visiting a doctor's office or shopping?: No   Past Medical History:  Past Medical History:  Diagnosis Date   Allergy    Hypothyroidism     Surgical History:   Past Surgical History:  Procedure Laterality Date   MOUTH SURGERY      Medications:  Current Outpatient Medications on File Prior to Visit  Medication Sig   Drospirenone (SLYND) 4 MG TABS Take 1 tablet (4 mg total) by mouth daily at 6 (six) AM.   levocetirizine (XYZAL) 5 MG tablet Take 5 mg by mouth every evening.   levothyroxine (SYNTHROID) 50 MCG tablet Take 1 tablet (50 mcg total) by mouth daily.   Prenatal Vit-Fe Fumarate-FA (PRENATAL MULTIVITAMIN) TABS tablet Take 1 tablet by mouth daily at 12 noon.   No current facility-administered medications on file prior to visit.    Allergies:  Allergies  Allergen Reactions   Ibuprofen Hives   Other    Prednisone Other (See Comments)    Severe low blood pressure, night sweats    Social History:  Social History   Socioeconomic History   Marital status: Married    Spouse name: Not on file   Number of children: Not on file   Years of education: Not on file   Highest education level: Master's degree (e.g., MA, MS, MEng, MEd, MSW, MBA)  Occupational History   Not on file  Tobacco Use   Smoking status: Never   Smokeless tobacco: Never  Vaping Use   Vaping status: Never Used  Substance and Sexual Activity   Alcohol use: Never   Drug use: Never   Sexual activity: Yes    Partners: Male    Birth control/protection: Pill  Other Topics Concern   Not on file  Social History Narrative   Not on file   Social Drivers of Health   Financial Resource Strain: Low Risk  (12/08/2023)   Overall Financial Resource Strain (CARDIA)    Difficulty of Paying Living Expenses: Not hard at all  Food Insecurity: No Food Insecurity (12/08/2023)   Hunger Vital Sign    Worried About Running Out of Food in the Last Year: Never true    Ran Out of Food in the Last Year: Never true  Transportation Needs: No Transportation Needs (12/08/2023)   PRAPARE - Administrator, Civil Service (Medical): No    Lack of Transportation (Non-Medical): No   Physical Activity: Insufficiently Active (12/08/2023)   Exercise Vital Sign    Days  of Exercise per Week: 1 day    Minutes of Exercise per Session: 30 min  Stress: No Stress Concern Present (12/08/2023)   [employer] of Occupational Health - Occupational Stress Questionnaire    Feeling of Stress : Not at all  Social Connections: Socially Integrated (12/08/2023)   Social Connection and Isolation Panel [NHANES]    Frequency of Communication with Friends and Family: More than three times a week    Frequency of Social Gatherings with Friends and Family: Once a week    Attends Religious Services: More than 4 times per year    Active Member of [employer] or Organizations: Yes    Attends Engineer, structural: More than 4 times per year    Marital Status: Married  Catering manager Violence: Not At Risk (07/18/2021)   Humiliation, Afraid, Rape, and Kick questionnaire    Fear of Current or Ex-Partner: No    Emotionally Abused: No    Physically Abused: No    Sexually Abused: No   Social History   Tobacco Use  Smoking Status Never  Smokeless Tobacco Never   Social History   Substance and Sexual Activity  Alcohol Use Never    Family History:  Family History  Problem Relation Age of Onset   Thyroid disease Mother    Hypertension Father    Depression Brother    Healthy Brother    Breast cancer Paternal Aunt 30   Cancer Paternal Aunt    Dementia Maternal Grandmother    Heart disease Maternal Grandmother    Arthritis Maternal Grandmother    Diabetes Maternal Grandmother    Heart attack Maternal Grandfather    Gastric cancer Paternal Grandmother    Cancer Paternal Grandmother    Alcohol abuse Paternal Grandfather    Drug abuse Maternal Aunt     Past medical history, surgical history, medications, allergies, family history and social history reviewed with patient today and changes made to appropriate areas of the chart.   ROS All other ROS negative except what is listed  above and in the HPI.      Objective:    BP 115/73   Pulse 73   Temp 98.7 F (37.1 C) (Oral)   Ht 5\' 7"  (1.702 m)   Wt 138 lb 3.2 oz (62.7 kg)   LMP 11/25/2023 (Approximate)   SpO2 98%   BMI 21.65 kg/m   Wt Readings from Last 3 Encounters:  12/09/23 138 lb 3.2 oz (62.7 kg)  09/04/23 139 lb 1.6 oz (63.1 kg)  06/02/23 134 lb 8 oz (61 kg)    Physical Exam Vitals and nursing note reviewed. Exam conducted with a chaperone present.  Constitutional:      General: She is awake. She is not in acute distress.    Appearance: She is well-developed and well-groomed. She is not ill-appearing or toxic-appearing.  HENT:     Head: Normocephalic and atraumatic.     Right Ear: Hearing, tympanic membrane, ear canal and external ear normal. No drainage.     Left Ear: Hearing, tympanic membrane, ear canal and external ear normal. No drainage.     Nose: Nose normal.     Right Sinus: No maxillary sinus tenderness or frontal sinus tenderness.     Left Sinus: No maxillary sinus tenderness or frontal sinus tenderness.     Mouth/Throat:     Mouth: Mucous membranes are moist.     Pharynx: Oropharynx is clear. Uvula midline. No pharyngeal swelling, oropharyngeal exudate or posterior oropharyngeal erythema.  Eyes:     General: Lids are normal.        Right eye: No discharge.        Left eye: No discharge.     Extraocular Movements: Extraocular movements intact.     Conjunctiva/sclera: Conjunctivae normal.     Pupils: Pupils are equal, round, and reactive to light.     Visual Fields: Right eye visual fields normal and left eye visual fields normal.  Neck:     Thyroid: No thyromegaly.     Vascular: No carotid bruit.     Trachea: Trachea normal.  Cardiovascular:     Rate and Rhythm: Normal rate and regular rhythm.     Heart sounds: Normal heart sounds. No murmur heard.    No gallop.  Pulmonary:     Effort: Pulmonary effort is normal. No accessory muscle usage or respiratory distress.     Breath  sounds: Normal breath sounds.  Chest:  Breasts:    Right: Normal.     Left: Normal.  Abdominal:     General: Bowel sounds are normal.     Palpations: Abdomen is soft. There is no hepatomegaly or splenomegaly.     Tenderness: There is no abdominal tenderness.  Musculoskeletal:        General: Normal range of motion.     Cervical back: Normal range of motion and neck supple.     Right lower leg: No edema.     Left lower leg: No edema.  Lymphadenopathy:     Head:     Right side of head: No submental, submandibular, tonsillar, preauricular or posterior auricular adenopathy.     Left side of head: No submental, submandibular, tonsillar, preauricular or posterior auricular adenopathy.     Cervical: No cervical adenopathy.     Upper Body:     Right upper body: No supraclavicular, axillary or pectoral adenopathy.     Left upper body: No supraclavicular, axillary or pectoral adenopathy.  Skin:    General: Skin is warm and dry.     Capillary Refill: Capillary refill takes less than 2 seconds.     Findings: No rash.  Neurological:     Mental Status: She is alert and oriented to person, place, and time.     Gait: Gait is intact.     Deep Tendon Reflexes: Reflexes are normal and symmetric.     Reflex Scores:      Brachioradialis reflexes are 2+ on the right side and 2+ on the left side.      Patellar reflexes are 2+ on the right side and 2+ on the left side. Psychiatric:        Attention and Perception: Attention normal.        Mood and Affect: Mood normal.        Speech: Speech normal.        Behavior: Behavior normal. Behavior is cooperative.        Thought Content: Thought content normal.        Judgment: Judgment normal.    Results for orders placed or performed in visit on 01/06/23  Sodium   Collection Time: 01/06/23  8:30 AM  Result Value Ref Range   Sodium 140 134 - 144 mmol/L  TSH   Collection Time: 01/06/23  8:30 AM  Result Value Ref Range   TSH 2.810 0.450 - 4.500  uIU/mL  T4, free   Collection Time: 01/06/23  8:30 AM  Result Value Ref Range   Free T4 1.50  0.82 - 1.77 ng/dL      Assessment & Plan:   Problem List Items Addressed This Visit       Endocrine   Hypothyroid - Primary   Diagnosed 10/17/21, stable.  Continue Levothyroxine dosing at this time and adjust based on labs.  Free T4 and TSH today.      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  Noted on past labs, continue to monitor annually.  LDL<190 and ASCVD 0.6%.      Relevant Orders   Comprehensive metabolic panel   Lipid Panel w/o Chol/HDL Ratio   Family history of high cholesterol   Check lipid panel today and yearly -- diet focus.       Relevant Orders   Lipid Panel w/o Chol/HDL Ratio   Other Visit Diagnoses       Encounter for annual physical exam       Annual physical today with labs and health maintenance reviewed, discussed with patient.   Relevant Orders   CBC with Differential/Platelet        Follow up plan: Return in about 1 year (around 12/08/2024) for Annual Physical.   LABORATORY TESTING:  - Pap smear: up to date  IMMUNIZATIONS:   - Tetanus vaccination status reviewed: last tetanus booster within 10 years. - Influenza: Refused - Pneumovax: Not applicable - Prevnar: Not applicable - COVID: Refused - HPV: Not applicable - Shingrix vaccine: Not applicable  SCREENING: -Mammogram: Up to date -- due next on 07/01/24 - Colonoscopy: Not applicable  - Bone Density: Not applicable  -Hearing Test: Not applicable  -Spirometry: Not applicable   PATIENT COUNSELING:   Advised to take 1 mg of folate supplement per day if capable of pregnancy.   Sexuality: Discussed sexually transmitted diseases, partner selection, use of condoms, avoidance of unintended pregnancy  and contraceptive alternatives.   Advised to avoid cigarette smoking.  I discussed  with the patient that most people either abstain from alcohol or drink within safe limits (<=14/week and <=4 drinks/occasion for males, <=7/weeks and <= 3 drinks/occasion for females) and that the risk for alcohol disorders and other health effects rises proportionally with the number of drinks per week and how often a drinker exceeds daily limits.  Discussed cessation/primary prevention of drug use and availability of treatment for abuse.   Diet: Encouraged to adjust caloric intake to maintain  or achieve ideal body weight, to reduce intake of dietary saturated fat and total fat, to limit sodium intake by avoiding high sodium foods and not adding table salt, and to maintain adequate dietary potassium and calcium preferably from fresh fruits, vegetables, and low-fat dairy products.    Stressed the importance of regular exercise  Injury prevention: Discussed safety belts, safety helmets, smoke detector, smoking near bedding or upholstery.   Dental health: Discussed importance of regular tooth brushing, flossing, and dental visits.    NEXT PREVENTATIVE PHYSICAL DUE IN 1 YEAR. Return in about 1 year (around 12/08/2024) for Annual Physical.

## 2023-12-10 ENCOUNTER — Other Ambulatory Visit: Payer: Self-pay | Admitting: Nurse Practitioner

## 2023-12-10 ENCOUNTER — Encounter: Payer: Self-pay | Admitting: Nurse Practitioner

## 2023-12-10 DIAGNOSIS — E039 Hypothyroidism, unspecified: Secondary | ICD-10-CM

## 2023-12-10 LAB — LIPID PANEL W/O CHOL/HDL RATIO
Cholesterol, Total: 207 mg/dL — ABNORMAL HIGH (ref 100–199)
HDL: 51 mg/dL (ref >39)
LDL Chol Calc (NIH): 133 mg/dL — ABNORMAL HIGH (ref 0–99)
Triglycerides: 127 mg/dL (ref 0–149)
VLDL Cholesterol Cal: 23 mg/dL (ref 5–40)

## 2023-12-10 LAB — CBC WITH DIFFERENTIAL/PLATELET
Basophils Absolute: 0.1 10*3/uL (ref 0.0–0.2)
Basos: 1 %
EOS (ABSOLUTE): 0.2 10*3/uL (ref 0.0–0.4)
Eos: 3 %
Hematocrit: 43.9 % (ref 34.0–46.6)
Hemoglobin: 14.8 g/dL (ref 11.1–15.9)
Immature Grans (Abs): 0 10*3/uL (ref 0.0–0.1)
Immature Granulocytes: 0 %
Lymphocytes Absolute: 2.1 10*3/uL (ref 0.7–3.1)
Lymphs: 34 %
MCH: 30.3 pg (ref 26.6–33.0)
MCHC: 33.7 g/dL (ref 31.5–35.7)
MCV: 90 fL (ref 79–97)
Monocytes Absolute: 0.7 10*3/uL (ref 0.1–0.9)
Monocytes: 11 %
Neutrophils Absolute: 3.2 10*3/uL (ref 1.4–7.0)
Neutrophils: 51 %
Platelets: 200 10*3/uL (ref 150–450)
RBC: 4.89 x10E6/uL (ref 3.77–5.28)
RDW: 12.5 % (ref 11.7–15.4)
WBC: 6.1 10*3/uL (ref 3.4–10.8)

## 2023-12-10 LAB — COMPREHENSIVE METABOLIC PANEL
ALT: 22 [IU]/L (ref 0–32)
AST: 27 [IU]/L (ref 0–40)
Albumin: 4.5 g/dL (ref 3.9–4.9)
Alkaline Phosphatase: 67 [IU]/L (ref 44–121)
BUN/Creatinine Ratio: 13 (ref 9–23)
BUN: 12 mg/dL (ref 6–24)
Bilirubin Total: 0.3 mg/dL (ref 0.0–1.2)
CO2: 16 mmol/L — ABNORMAL LOW (ref 20–29)
Calcium: 9.6 mg/dL (ref 8.7–10.2)
Chloride: 105 mmol/L (ref 96–106)
Creatinine, Ser: 0.92 mg/dL (ref 0.57–1.00)
Globulin, Total: 2.5 g/dL (ref 1.5–4.5)
Glucose: 85 mg/dL (ref 70–99)
Potassium: 4.2 mmol/L (ref 3.5–5.2)
Sodium: 141 mmol/L (ref 134–144)
Total Protein: 7 g/dL (ref 6.0–8.5)
eGFR: 79 mL/min/{1.73_m2} (ref >59)

## 2023-12-10 LAB — TSH: TSH: 5.52 u[IU]/mL — ABNORMAL HIGH (ref 0.450–4.500)

## 2023-12-10 LAB — T4, FREE: Free T4: 1.33 ng/dL (ref 0.82–1.77)

## 2023-12-10 NOTE — Progress Notes (Signed)
Contacted via MyChart - lab only visit in 4 weeks please   Good morning Elaine Cross, your labs have returned: - Kidney function, creatinine and eGFR, remains normal, as is liver function, AST and ALT.  - Lipid panel continues to show elevation in LDL -- continue focus on regular exercise and health diet. - CBC shows no anemia or infection. - Thyroid -- TSH is a little elevated again, but Free T4 normal.  Have you missed any recent Levothyroxine doses? Continuing to take every morning 30 minutes before eating or other medications/supplements?  Let me know.  I do not want to change dose just yet.  I would like to recheck level in 4 weeks and if TSH remains elevated then I may adjust dose.  Any questions? Keep being stellar!!  Thank you for allowing me to participate in your care.  I appreciate you. Kindest regards, Noemi Ishmael

## 2023-12-18 NOTE — Progress Notes (Signed)
 Called patient, left voice mail for patient to call and schedule an appt for follow up labs in 4 weeks.

## 2024-03-08 ENCOUNTER — Other Ambulatory Visit

## 2024-03-08 DIAGNOSIS — E039 Hypothyroidism, unspecified: Secondary | ICD-10-CM

## 2024-03-09 ENCOUNTER — Ambulatory Visit: Payer: Self-pay | Admitting: Nurse Practitioner

## 2024-03-09 LAB — TSH: TSH: 4.92 u[IU]/mL — ABNORMAL HIGH (ref 0.450–4.500)

## 2024-03-09 LAB — T4, FREE: Free T4: 1.34 ng/dL (ref 0.82–1.77)

## 2024-03-09 MED ORDER — LEVOTHYROXINE SODIUM 75 MCG PO TABS: 75.0000 ug | ORAL_TABLET | Freq: Every day | ORAL | 1 refills | Status: DC

## 2024-03-09 NOTE — Progress Notes (Signed)
 Contacted via MyChart -- needs 6 week lab only visit please   Good morning Elaine Cross, your labs have returned and TSH remains a little elevated.  Free T4 normal.  Lets increase Levothyroxine  to 75 MCG daily, stop 50 MCG dosing.  Then in 6 weeks we will recheck.  Any questions? Keep being stellar!!  Thank you for allowing me to participate in your care.  I appreciate you. Kindest regards, Dayton Kenley

## 2024-03-10 NOTE — Progress Notes (Signed)
 Called patient left message for patient to call back and schedule 6 week lab only visit

## 2024-04-08 ENCOUNTER — Other Ambulatory Visit: Payer: Self-pay | Admitting: Certified Nurse Midwife

## 2024-05-25 ENCOUNTER — Other Ambulatory Visit: Payer: Self-pay | Admitting: Nurse Practitioner

## 2024-05-25 DIAGNOSIS — Z1231 Encounter for screening mammogram for malignant neoplasm of breast: Secondary | ICD-10-CM

## 2024-06-02 ENCOUNTER — Other Ambulatory Visit: Payer: Self-pay | Admitting: Certified Nurse Midwife

## 2024-06-02 ENCOUNTER — Ambulatory Visit: Admitting: Certified Nurse Midwife

## 2024-06-02 ENCOUNTER — Encounter: Payer: Self-pay | Admitting: Certified Nurse Midwife

## 2024-06-02 ENCOUNTER — Other Ambulatory Visit (HOSPITAL_COMMUNITY)
Admission: RE | Admit: 2024-06-02 | Discharge: 2024-06-02 | Disposition: A | Discharge status: Home / Self Care | Source: Ambulatory Visit | Attending: Certified Nurse Midwife | Admitting: Certified Nurse Midwife

## 2024-06-02 VITALS — BP 124/79 | HR 87 | Ht 65.0 in | Wt 137.6 lb

## 2024-06-02 DIAGNOSIS — Z124 Encounter for screening for malignant neoplasm of cervix: Secondary | ICD-10-CM | POA: Insufficient documentation

## 2024-06-02 DIAGNOSIS — Z01419 Encounter for gynecological examination (general) (routine) without abnormal findings: Secondary | ICD-10-CM | POA: Insufficient documentation

## 2024-06-02 DIAGNOSIS — Z1231 Encounter for screening mammogram for malignant neoplasm of breast: Secondary | ICD-10-CM

## 2024-06-02 MED ORDER — SLYND 4 MG PO TABS: 1.0000 | ORAL_TABLET | Freq: Every day | ORAL | 4 refills | Status: DC

## 2024-06-02 NOTE — Progress Notes (Signed)
 GYNECOLOGY ANNUAL PREVENTATIVE CARE ENCOUNTER NOTE  History:     Elaine Cross is a 43 y.o. [phone] female here for a routine annual gynecologic exam.  Current complaints: none.   Denies abnormal vaginal bleeding, discharge, pelvic pain, problems with intercourse or other gynecologic concerns.     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   Gynecologic History Patient's last menstrual period was 05/30/2024 (exact date). Contraception: pop Last Pap: 03/02/2021. Results were: normal with negative HPV Last mammogram: 07/02/23. Results were: normal  Obstetric History OB History  Gravida Para Term Preterm AB Living  3    1 2   SAB IAB Ectopic Multiple Live Births  1        # Outcome Date GA Lbr Len/2nd Weight Sex Type Anes PTL Lv  3 Gravida           2 Gravida           1 SAB             Past Medical History:  Diagnosis Date   Allergy    Hypothyroidism     Past Surgical History:  Procedure Laterality Date   MOUTH SURGERY      Current Outpatient Medications on File Prior to Visit  Medication Sig Dispense Refill   Drospirenone  (SLYND ) 4 MG TABS Take 1 tablet (4 mg total) by mouth daily at 6 (six) AM. 84 tablet 4   levocetirizine (XYZAL) 5 MG tablet Take 5 mg by mouth every evening.     levothyroxine  (SYNTHROID ) 75 MCG tablet Take 1 tablet (75 mcg total) by mouth daily. 90 tablet 1   Prenatal Vit-Fe Fumarate-FA (PRENATAL MULTIVITAMIN) TABS tablet Take 1 tablet by mouth daily at 12 noon.     No current facility-administered medications on file prior to visit.    Allergies  Allergen Reactions   Ibuprofen Hives   Other    Prednisone Other (See Comments)    Severe low blood pressure, night sweats    Social History:  reports that she has never smoked. She has never used smokeless tobacco. She reports that she does not drink alcohol and does  not use drugs.  Family History  Problem Relation Age of Onset   Thyroid disease Mother    Hypertension Father    Depression Brother    Healthy Brother    Breast cancer Paternal Aunt 30   Cancer Paternal Aunt    Dementia Maternal Grandmother    Heart disease Maternal Grandmother    Arthritis Maternal Grandmother    Diabetes Maternal Grandmother    Heart attack Maternal Grandfather    Gastric cancer Paternal Grandmother    Cancer Paternal Grandmother    Alcohol abuse Paternal Grandfather    Drug abuse Maternal Aunt     The following portions of the patient's history were reviewed and updated as appropriate: allergies, current medications, past family history, past medical history, past social history, past surgical history and problem list.  Review of Systems Pertinent items noted in HPI and remainder of comprehensive ROS otherwise negative.  Physical Exam:  BP 124/79   Pulse 87   Ht 5' 5 (1.651 m)   Wt 137 lb 9.6 oz (62.4 kg)   LMP 05/30/2024 (Exact Date)   BMI 22.90 kg/m  CONSTITUTIONAL: Well-developed, well-nourished female in no acute distress.  HENT:  Normocephalic, atraumatic, External right and left ear normal. Oropharynx is clear and  moist EYES: Conjunctivae and EOM are normal. Pupils are equal, round, and reactive to light. No scleral icterus.  NECK: Normal range of motion, supple, no masses.  Normal thyroid.  SKIN: Skin is warm and dry. No rash noted. Not diaphoretic. No erythema. No pallor. MUSCULOSKELETAL: Normal range of motion. No tenderness.  No cyanosis, clubbing, or edema.  2+ distal pulses. NEUROLOGIC: Alert and oriented to person, place, and time. Normal reflexes, muscle tone coordination.  PSYCHIATRIC: Normal mood and affect. Normal behavior. Normal judgment and thought content. CARDIOVASCULAR: Normal heart rate noted, regular rhythm RESPIRATORY: Clear to auscultation bilaterally. Effort and breath sounds normal, no problems with respiration  noted. BREASTS: Symmetric in size. No masses, tenderness, skin changes, nipple drainage, or lymphadenopathy bilaterally.  ABDOMEN: Soft, no distention noted.  No tenderness, rebound or guarding.  PELVIC: Normal appearing external genitalia and urethral meatus; normal appearing vaginal mucosa and cervix.  No abnormal discharge noted.  Pap smear obtained.  Normal uterine size, no other palpable masses, no uterine or adnexal tenderness.  .   Assessment and Plan:  Annual Well Women GYN Exam   Pap: Will follow up results of pap smear and manage accordingly. Mammogram : ordered  Labs: none  Refills: pop Referral: none  Routine preventative health maintenance measures emphasized. Please refer to After Visit Summary for other counseling recommendations.      Zelda Hummer, CNM [facility]  [facility],  [facility], [facility]

## 2024-06-02 NOTE — Patient Instructions (Signed)
 Preventive Care 58-45 Years Old, Female  Preventive care refers to lifestyle choices and visits with your health care provider that can promote health and wellness. Preventive care visits are also called wellness exams.  What can I expect for my preventive care visit?  Counseling  Your health care provider may ask you questions about your:  Medical history, including:  Past medical problems.  Family medical history.  Pregnancy history.  Current health, including:  Menstrual cycle.  Method of birth control.  Emotional well-being.  Home life and relationship well-being.  Sexual activity and sexual health.  Lifestyle, including:  Alcohol, nicotine or tobacco, and drug use.  Access to firearms.  Diet, exercise, and sleep habits.  Work and work Astronomer.  Sunscreen use.  Safety issues such as seatbelt and bike helmet use.  Physical exam  Your health care provider will check your:  Height and weight. These may be used to calculate your BMI (body mass index). BMI is a measurement that tells if you are at a healthy weight.  Waist circumference. This measures the distance around your waistline. This measurement also tells if you are at a healthy weight and may help predict your risk of certain diseases, such as type 2 diabetes and high blood pressure.  Heart rate and blood pressure.  Body temperature.  Skin for abnormal spots.  What immunizations do I need?    Vaccines are usually given at various ages, according to a schedule. Your health care provider will recommend vaccines for you based on your age, medical history, and lifestyle or other factors, such as travel or where you work.  What tests do I need?  Screening  Your health care provider may recommend screening tests for certain conditions. This may include:  Lipid and cholesterol levels.  Diabetes screening. This is done by checking your blood sugar (glucose) after you have not eaten for a while (fasting).  Pelvic exam and Pap test.  Hepatitis B test.  Hepatitis C  test.  HIV (human immunodeficiency virus) test.  STI (sexually transmitted infection) testing, if you are at risk.  Lung cancer screening.  Colorectal cancer screening.  Mammogram. Talk with your health care provider about when you should start having regular mammograms. This may depend on whether you have a family history of breast cancer.  BRCA-related cancer screening. This may be done if you have a family history of breast, ovarian, tubal, or peritoneal cancers.  Bone density scan. This is done to screen for osteoporosis.  Talk with your health care provider about your test results, treatment options, and if necessary, the need for more tests.  Follow these instructions at home:  Eating and drinking    Eat a diet that includes fresh fruits and vegetables, whole grains, lean protein, and low-fat dairy products.  Take vitamin and mineral supplements as recommended by your health care provider.  Do not drink alcohol if:  Your health care provider tells you not to drink.  You are pregnant, may be pregnant, or are planning to become pregnant.  If you drink alcohol:  Limit how much you have to 0-1 drink a day.  Know how much alcohol is in your drink. In the [place] one drink equals one 12 oz bottle of beer (355 mL), one 5 oz glass of wine (148 mL), or one 1 oz glass of hard liquor (44 mL).  Lifestyle  Brush your teeth every morning and night with fluoride toothpaste. Floss one time each day.  Exercise for at least  30 minutes 5 or more days each week.  Do not use any products that contain nicotine or tobacco. These products include cigarettes, chewing tobacco, and vaping devices, such as e-cigarettes. If you need help quitting, ask your health care provider.  Do not use drugs.  If you are sexually active, practice safe sex. Use a condom or other form of protection to prevent STIs.  If you do not wish to become pregnant, use a form of birth control. If you plan to become pregnant, see your health care provider for a  prepregnancy visit.  Take aspirin only as told by your health care provider. Make sure that you understand how much to take and what form to take. Work with your health care provider to find out whether it is safe and beneficial for you to take aspirin daily.  Find healthy ways to manage stress, such as:  Meditation, yoga, or listening to music.  Journaling.  Talking to a trusted person.  Spending time with friends and family.  Minimize exposure to UV radiation to reduce your risk of skin cancer.  Safety  Always wear your seat belt while driving or riding in a vehicle.  Do not drive:  If you have been drinking alcohol. Do not ride with someone who has been drinking.  When you are tired or distracted.  While texting.  If you have been using any mind-altering substances or drugs.  Wear a helmet and other protective equipment during sports activities.  If you have firearms in your house, make sure you follow all gun safety procedures.  Seek help if you have been physically or sexually abused.  What's next?  Visit your health care provider once a year for an annual wellness visit.  Ask your health care provider how often you should have your eyes and teeth checked.  Stay up to date on all vaccines.  This information is not intended to replace advice given to you by your health care provider. Make sure you discuss any questions you have with your health care provider.  Document Revised: 04/04/2021 Document Reviewed: 04/04/2021  Elsevier Patient Education  2024 [employer].

## 2024-06-03 ENCOUNTER — Telehealth: Payer: Self-pay

## 2024-06-03 MED ORDER — SLYND 4 MG PO TABS: 1.0000 | ORAL_TABLET | Freq: Every day | ORAL | 4 refills | Status: AC

## 2024-06-03 NOTE — Telephone Encounter (Signed)
 Pt calling triage requesting Rx Drospirenone  (SLYND ) 4 MG TABS be sent to her correct pharmacy [facility]. She asked for me to call [facility] to cancel Rx there, Rx cancelled. Rx resent.

## 2024-06-04 LAB — CYTOLOGY - PAP
Comment: NEGATIVE
Diagnosis: NEGATIVE
High risk HPV: NEGATIVE

## 2024-06-27 ENCOUNTER — Encounter: Payer: Self-pay | Admitting: Nurse Practitioner

## 2024-06-28 MED ORDER — ONDANSETRON HCL 4 MG PO TABS: 4.0000 mg | ORAL_TABLET | Freq: Three times a day (TID) | ORAL | 0 refills | Status: DC | PRN

## 2024-07-12 ENCOUNTER — Ambulatory Visit
Admission: RE | Admit: 2024-07-12 | Discharge: 2024-07-12 | Disposition: A | Discharge status: Home / Self Care | Source: Ambulatory Visit | Attending: Nurse Practitioner | Admitting: Nurse Practitioner

## 2024-07-12 DIAGNOSIS — Z1231 Encounter for screening mammogram for malignant neoplasm of breast: Secondary | ICD-10-CM | POA: Insufficient documentation

## 2024-07-13 ENCOUNTER — Ambulatory Visit: Payer: Self-pay | Admitting: Nurse Practitioner

## 2024-07-13 NOTE — Progress Notes (Signed)
 Contacted via MyChart   Normal mammogram, may repeat in one year:)

## 2024-07-15 ENCOUNTER — Other Ambulatory Visit: Payer: Self-pay | Admitting: Nurse Practitioner

## 2024-07-16 NOTE — Telephone Encounter (Signed)
 Requested medication (s) are due for refill today: routing for review  Requested medication (s) are on the active medication list: yes  Last refill:  06/28/24  Future visit scheduled: yes  Notes to clinic:  Unable to refill per protocol, cannot delegate.      Requested Prescriptions  Pending Prescriptions Disp Refills   ondansetron  (ZOFRAN ) 4 MG tablet [Pharmacy Med Name: ONDANSETRON  HCL 4 MG TABLET] 18 tablet 1    Sig: TAKE 1 TABLET BY MOUTH EVERY 8 HOURS AS NEEDED FOR NAUSEA AND VOMITING     Not Delegated - Gastroenterology: Antiemetics - ondansetron  Failed - 07/16/2024 11:20 AM      Failed - This refill cannot be delegated      Failed - Valid encounter within last 6 months    Recent Outpatient Visits           7 months ago Hypothyroidism, unspecified type   [facility] [place], Melanie T, NP       Future Appointments             In 5 months Oak[place], Jolene T, NP [place] [facility], [address]            Passed - AST in normal range and within 360 days    AST  Date Value Ref Range Status  12/09/2023 27 0 - 40 IU/L Final         Passed - ALT in normal range and within 360 days    ALT  Date Value Ref Range Status  12/09/2023 22 0 - 32 IU/L Final

## 2024-07-20 ENCOUNTER — Telehealth: Payer: Self-pay

## 2024-07-20 DIAGNOSIS — E039 Hypothyroidism, unspecified: Secondary | ICD-10-CM

## 2024-07-20 NOTE — Telephone Encounter (Signed)
 Copied from CRM #[id number]. Topic: Clinical - Request for Lab/Test Order >> Jul 20, 2024  3:47 PM Shanda MATSU wrote: Reason for CRM: Patient calling in to schedule labs for thyroid check, no active order for this on file, please assist with order so that patient can schedule.

## 2024-07-20 NOTE — Telephone Encounter (Signed)
 Called patient and left a message for her to call back to get scheduled.

## 2024-07-21 NOTE — Telephone Encounter (Signed)
 Scheduled

## 2024-07-27 ENCOUNTER — Other Ambulatory Visit

## 2024-07-27 DIAGNOSIS — E039 Hypothyroidism, unspecified: Secondary | ICD-10-CM

## 2024-07-28 ENCOUNTER — Ambulatory Visit: Payer: Self-pay | Admitting: Nurse Practitioner

## 2024-07-28 LAB — TSH: TSH: 0.462 u[IU]/mL (ref 0.450–4.500)

## 2024-07-28 LAB — T4, FREE: Free T4: 1.66 ng/dL (ref 0.82–1.77)

## 2024-07-28 NOTE — Progress Notes (Signed)
 Contacted via MyChart  Thyroid labs normal on this check.  Great news!!

## 2024-08-08 ENCOUNTER — Encounter: Payer: Self-pay | Admitting: Nurse Practitioner

## 2024-08-09 MED ORDER — LEVOTHYROXINE SODIUM 75 MCG PO TABS: 75.0000 ug | ORAL_TABLET | Freq: Every day | ORAL | 3 refills | Status: AC

## 2024-08-29 ENCOUNTER — Other Ambulatory Visit: Payer: Self-pay | Admitting: Nurse Practitioner

## 2024-08-31 NOTE — Telephone Encounter (Signed)
 Requested Prescriptions  Refused Prescriptions Disp Refills   levothyroxine  (SYNTHROID ) 75 MCG tablet [Pharmacy Med Name: LEVOTHYROXINE  75 MCG TABLET] 90 tablet 1    Sig: TAKE 1 TABLET BY MOUTH EVERY DAY     Endocrinology:  Hypothyroid Agents Passed - 08/31/2024 10:00 AM      Passed - TSH in normal range and within 360 days    TSH  Date Value Ref Range Status  07/27/2024 0.462 0.450 - 4.500 uIU/mL Final         Passed - Valid encounter within last 12 months    Recent Outpatient Visits           8 months ago Hypothyroidism, unspecified type   [facility] [place], Melanie DASEN, NP       Future Appointments             In 3 months Cannady, Jolene T, NP [place] [facility], [address]

## 2024-10-12 ENCOUNTER — Encounter: Payer: Self-pay | Admitting: Nurse Practitioner

## 2024-10-12 NOTE — Patient Instructions (Signed)
 Perimenopause: What to Know Perimenopause is the time in your life when your levels of estrogen start to go down. Estrogen is the female hormone made by your ovaries. Perimenopause can start 2-8 years before menopause. It can cause changes to your menstrual period. During this time, your ovaries may or may not make an egg. In many cases, you can still get pregnant. What are the causes? Perimenopause is a natural change in your homone levels that happens as you get older. What increases the risk? You're more likely to start perimenopause early if: You have an abnormal growth (tumor) of the pituitary gland in your brain. You have a disease that affects your ovaries. You've had certain treatments for cancer. These include: Chemotherapy. Hormone therapy. Radiation therapy on the area between your hips (pelvis). You smoke a lot or drink a lot of alcohol. Other family members have gone through menopause early. What are the signs or symptoms? Symptoms are unique to each person. You may have: Hot flashes. Irregular periods. Night sweats. Changes in how you feel about sex. You may have less of a sex drive or feel more discomfort around your sexuality. Vaginal dryness. Headaches. Mood swings. Other symptoms may include: Depression. This is when you feel sad or hopeless. Trouble sleeping. Memory problems or trouble focusing. Irritability. This means getting annoyed easily. Tiredness. Weight gain. Anxiety. This is feeling worried or nervous. You can also have trouble getting pregnant. How is this diagnosed? You may be diagnosed based on: Your medical history. An exam. Your age. Your history of menstrual periods. Your symptoms. Hormone tests. How is this treated? In some cases, no treatment is needed. Talk with your health care provider about if you should get treated. Treatments may include: Menopausal hormone therapy (MHT). Medicines to treat certain  symptoms. Acupuncture. Vitamin or herbal supplements. Before you start treatment, let your provider know if you or anyone in your family has or has had: Heart disease. Breast cancer. Blood clots. Diabetes. Osteoporosis. Follow these instructions at home: Eating and drinking  Eat a balanced diet. It should include: Fresh fruits and vegetables. Whole grains. Soybeans. Eggs. Lean meat. Low-fat dairy. To help prevent hot flashes, stay away from: Alcohol. Drinks with caffeine in them. Spicy foods. Lifestyle Do not smoke, vape, or use nicotine or tobacco. Get at least 30 minutes of physical activity on 5 or more days each week. Get 7-8 hours of sleep each night. Dress in layers that can be taken off if you have a hot flash. Find ways to manage stress. You may want to try: Deep breathing. Meditation. Writing in a journal. General instructions  Take your medicines only as told. Keep track of your periods. Track: When they happen. How heavy they are. How long they last. How much time passes between periods. Keep track of your symptoms. Track: When they start. How often you have them. How long they last. Use vaginal lubricants or moisturizers. These can help with: Vaginal dryness. Comfort during sex. You can still get pregnant if you're having any periods. Make sure you use birth control if you don't want to get pregnant. Contact a health care provider if: You have a very heavy period or pass blood clots. Your period lasts more than 2 days longer than normal. Your period comes back sooner than 21 days. You bleed after having sex. You have pain during sex. You have pain when you pee. You get very bad headaches. You have trouble with your eyesight. Get help right away if: You  have chest pain. You have trouble breathing. You have trouble talking. You have very bad depression. This information is not intended to replace advice given to you by your health care provider.  Make sure you discuss any questions you have with your health care provider. Document Revised: 06/12/2023 Document Reviewed: 06/12/2023 Elsevier Patient Education  2024 [employer].

## 2024-10-15 ENCOUNTER — Ambulatory Visit (INDEPENDENT_AMBULATORY_CARE_PROVIDER_SITE_OTHER): Admitting: Nurse Practitioner

## 2024-10-15 ENCOUNTER — Encounter: Payer: Self-pay | Admitting: Nurse Practitioner

## 2024-10-15 VITALS — BP 130/78 | HR 80 | Temp 97.7°F | Resp 17 | Ht 65.0 in | Wt 137.0 lb

## 2024-10-15 DIAGNOSIS — E039 Hypothyroidism, unspecified: Secondary | ICD-10-CM

## 2024-10-15 DIAGNOSIS — R002 Palpitations: Secondary | ICD-10-CM | POA: Diagnosis not present

## 2024-10-15 NOTE — Progress Notes (Signed)
 "  BP 130/78 (BP Location: Left Arm, Patient Position: Sitting, Cuff Size: Normal)   Pulse 80   Temp 97.7 F (36.5 C) (Oral)   Resp 17   Ht 5' 5 (1.651 m)   Wt 137 lb (62.1 kg)   SpO2 99%   BMI 22.80 kg/m    Subjective:    Patient ID: Elaine Cross, female    DOB: 07-03-1981, 43 y.o.   MRN: [id number]  HPI: Elaine Cross Men is a 43 y.o. female  Chief Complaint  Patient presents with   Medication Symptoms    Thinks she may have some side effects from levothyroxine  and was told she may need some blood work.    HYPOTHYROIDISM Currently taking Levothyroxine  75 MCG, which was adjusted to this around 08/09/24. Recently has noticed more anxiety and having heart palpitations, the palpitations often start when she is in bed, tired, and watching television. Symptoms have been present for the past 6 weeks. Currently a little better.  Perimenopause symptoms present -- last month had extremely heavy cycle which was new for her, past several months has been waking up on occasion in middle of night, brain fog, morning joint stiffness, increased indigestion, palpitations. Two episodes of indigestion felt so bad that she felt chest discomfort, took Pepto and this improved it. Thyroid control status:stable Satisfied with current treatment? yes Medication side effects: no Medication compliance: good compliance Etiology of hypothyroidism: unknown Recent dose adjustment:no Fatigue: no Cold intolerance: no Heat intolerance: no Weight gain: no Weight loss: no Constipation: no Diarrhea/loose stools: no Palpitations: yes Lower extremity edema: no Anxiety/depressed mood: yes   PALPITATIONS Duration: weeks Symptom description: flutter sensation as above noted Duration of episode: seconds Frequency: new onset Activity when event occurred: resting or lying down Related to exertion: no Dyspnea: no Chest pain: no Syncope: no Anxiety/stress: yes Nausea/vomiting: no Diaphoresis:  no Coronary artery disease: no Congestive heart failure: no Arrhythmia:no Thyroid disease: yes Caffeine intake: none Status:  stable Treatments attempted:none     10/15/2024   10:14 AM 12/09/2023    8:06 AM 12/04/2022    8:15 AM 06/03/2022    8:07 AM 01/15/2022    3:50 PM  Depression screen PHQ 2/9  Decreased Interest 0 0 0 0 0  Down, Depressed, Hopeless 0 0 0 0 0  PHQ - 2 Score 0 0 0 0 0  Altered sleeping 0 0 0 0 1  Tired, decreased energy 0 0 0 0 0  Change in appetite 0 0 0 0 0  Feeling bad or failure about yourself  0 0 0 0 0  Trouble concentrating 0 0 0 0 0  Moving slowly or fidgety/restless 0 0 0 0 0  Suicidal thoughts 0 0 0 0 0  PHQ-9 Score 0 0  0  0  1   Difficult doing work/chores  Not difficult at all Not difficult at all Not difficult at all      Data saved with a previous flowsheet row definition       10/15/2024   10:15 AM 12/09/2023    8:07 AM 12/04/2022    8:15 AM 06/03/2022    8:07 AM  GAD 7 : Generalized Anxiety Score  Nervous, Anxious, on Edge 0 0 0 0  Control/stop worrying 0 0 0 0  Worry too much - different things 0 0 0 0  Trouble relaxing 0 0 0 0  Restless 0 0 0 0  Easily annoyed or irritable 0 0 0 0  Afraid -  awful might happen 0 0 0 0  Total GAD 7 Score 0 0 0 0  Anxiety Difficulty  Not difficult at all Not difficult at all Not difficult at all   Relevant past medical, surgical, family and social history reviewed and updated as indicated. Interim medical history since our last visit reviewed. Allergies and medications reviewed and updated.  Review of Systems  Constitutional:  Negative for activity change, appetite change, diaphoresis, fatigue and fever.  Respiratory:  Negative for cough, chest tightness, shortness of breath and wheezing.   Cardiovascular:  Positive for palpitations. Negative for chest pain and leg swelling.  Gastrointestinal: Negative.   Neurological: Negative.   Psychiatric/Behavioral: Negative.      Per HPI unless  specifically indicated above     Objective:    BP 130/78 (BP Location: Left Arm, Patient Position: Sitting, Cuff Size: Normal)   Pulse 80   Temp 97.7 F (36.5 C) (Oral)   Resp 17   Ht 5' 5 (1.651 m)   Wt 137 lb (62.1 kg)   SpO2 99%   BMI 22.80 kg/m   Wt Readings from Last 3 Encounters:  10/15/24 137 lb (62.1 kg)  06/02/24 137 lb 9.6 oz (62.4 kg)  12/09/23 138 lb 3.2 oz (62.7 kg)    Physical Exam Vitals and nursing note reviewed.  Constitutional:      General: She is awake. She is not in acute distress.    Appearance: She is well-developed and well-groomed. She is not ill-appearing or toxic-appearing.  HENT:     Head: Normocephalic.     Right Ear: Hearing and external ear normal.     Left Ear: Hearing and external ear normal.  Eyes:     General: Lids are normal.        Right eye: No discharge.        Left eye: No discharge.     Conjunctiva/sclera: Conjunctivae normal.     Pupils: Pupils are equal, round, and reactive to light.  Neck:     Thyroid: No thyromegaly.     Vascular: No carotid bruit.  Cardiovascular:     Rate and Rhythm: Normal rate and regular rhythm.     Heart sounds: Normal heart sounds. No murmur heard.    No gallop.  Pulmonary:     Effort: Pulmonary effort is normal. No accessory muscle usage or respiratory distress.     Breath sounds: Normal breath sounds. No decreased breath sounds, wheezing or rales.  Abdominal:     General: Bowel sounds are normal. There is no distension.     Palpations: Abdomen is soft.     Tenderness: There is no abdominal tenderness.  Musculoskeletal:     Cervical back: Normal range of motion and neck supple.     Right lower leg: No edema.     Left lower leg: No edema.  Lymphadenopathy:     Cervical: No cervical adenopathy.  Skin:    General: Skin is warm and dry.  Neurological:     Mental Status: She is alert and oriented to person, place, and time.     Deep Tendon Reflexes: Reflexes are normal and symmetric.      Reflex Scores:      Brachioradialis reflexes are 2+ on the right side and 2+ on the left side.      Patellar reflexes are 2+ on the right side and 2+ on the left side. Psychiatric:        Attention and Perception: Attention normal.  Mood and Affect: Mood normal.        Speech: Speech normal.        Behavior: Behavior normal. Behavior is cooperative.        Thought Content: Thought content normal.     Results for orders placed or performed in visit on 07/27/24  TSH   Collection Time: 07/27/24  8:22 AM  Result Value Ref Range   TSH 0.462 0.450 - 4.500 uIU/mL  T4, free   Collection Time: 07/27/24  8:22 AM  Result Value Ref Range   Free T4 1.66 0.82 - 1.77 ng/dL      Assessment & Plan:   Problem List Items Addressed This Visit       Endocrine   Hypothyroid - Primary   Diagnosed 10/17/21, stable.  Continue Levothyroxine  dosing at this time and adjust based on labs.  Free T4 and TSH today due to symptoms at present.      Relevant Orders   T4, free   TSH     Other   Palpitations   ?if related to thyroid vs hormone shifts with perimenopause. Check labs today, including hormone levels, thyroid, and BMP.  Educated patient on these changes. Will determine next steps after labs return. She is having no red flag symptoms. Could consider Zio monitor if ongoing.      Relevant Orders   FSH/LH   Estrogens, total   Progesterone   Basic metabolic panel with GFR     Follow up plan: Return for as scheduled February.      "

## 2024-10-15 NOTE — Assessment & Plan Note (Signed)
?  if related to thyroid vs hormone shifts with perimenopause. Check labs today, including hormone levels, thyroid, and BMP.  Educated patient on these changes. Will determine next steps after labs return. She is having no red flag symptoms. Could consider Zio monitor if ongoing.

## 2024-10-15 NOTE — Assessment & Plan Note (Signed)
 Diagnosed 10/17/21, stable.  Continue Levothyroxine  dosing at this time and adjust based on labs.  Free T4 and TSH today due to symptoms at present.

## 2024-10-16 ENCOUNTER — Ambulatory Visit: Payer: Self-pay | Admitting: Nurse Practitioner

## 2024-10-16 NOTE — Progress Notes (Signed)
 Contacted via MyChart  Good day Elaine Cross, your labs have returned with exception of one which is still pending. Thyroid labs are within normal range and electrolytes all normal. Hormone levels show you are still having cycles, however remember as we discussed that labs are not definitive in perimenopause. Based on your symptoms I suspect you are there. Majority of women on average go through menopause at age 45 or 15, but the range of ages can be anywhere from 26 to 6 with some outliers in between. Perimenopausal symptoms can start on average 7 to 10 years prior to menopause and last for a bit afterwards. Any questions?

## 2024-10-17 LAB — BASIC METABOLIC PANEL WITH GFR
BUN/Creatinine Ratio: 12 (ref 9–23)
BUN: 11 mg/dL (ref 6–24)
CO2: 20 mmol/L (ref 20–29)
Calcium: 9.3 mg/dL (ref 8.7–10.2)
Chloride: 105 mmol/L (ref 96–106)
Creatinine, Ser: 0.92 mg/dL (ref 0.57–1.00)
Glucose: 88 mg/dL (ref 70–99)
Potassium: 4.3 mmol/L (ref 3.5–5.2)
Sodium: 140 mmol/L (ref 134–144)
eGFR: 78 mL/min/1.73

## 2024-10-17 LAB — TSH: TSH: 1.84 u[IU]/mL (ref 0.450–4.500)

## 2024-10-17 LAB — T4, FREE: Free T4: 1.49 ng/dL (ref 0.82–1.77)

## 2024-10-17 LAB — ESTROGENS, TOTAL: Estrogen: 109 pg/mL

## 2024-10-17 LAB — FSH/LH
FSH: 7.2 m[IU]/mL
LH: 3.2 m[IU]/mL

## 2024-10-17 LAB — PROGESTERONE: Progesterone: 0.3 ng/mL

## 2024-12-13 ENCOUNTER — Encounter: Payer: Managed Care, Other (non HMO) | Admitting: Nurse Practitioner
